# Patient Record
Sex: Female | Born: 1945 | Race: White | Hispanic: No | Marital: Married | Smoking: Never smoker
Health system: Southern US, Community
[De-identification: ages and names within clinical notes are randomized; demographics above are authoritative.]

## PROBLEM LIST (undated history)

## (undated) DIAGNOSIS — F329 Major depressive disorder, single episode, unspecified: Secondary | ICD-10-CM

## (undated) DIAGNOSIS — E7800 Pure hypercholesterolemia, unspecified: Secondary | ICD-10-CM

## (undated) DIAGNOSIS — E039 Hypothyroidism, unspecified: Secondary | ICD-10-CM

## (undated) DIAGNOSIS — S62109A Fracture of unspecified carpal bone, unspecified wrist, initial encounter for closed fracture: Secondary | ICD-10-CM

## (undated) DIAGNOSIS — F32A Depression, unspecified: Secondary | ICD-10-CM

## (undated) DIAGNOSIS — K219 Gastro-esophageal reflux disease without esophagitis: Secondary | ICD-10-CM

## (undated) DIAGNOSIS — M810 Age-related osteoporosis without current pathological fracture: Secondary | ICD-10-CM

## (undated) DIAGNOSIS — R011 Cardiac murmur, unspecified: Secondary | ICD-10-CM

## (undated) DIAGNOSIS — F419 Anxiety disorder, unspecified: Secondary | ICD-10-CM

## (undated) DIAGNOSIS — N159 Renal tubulo-interstitial disease, unspecified: Secondary | ICD-10-CM

## (undated) HISTORY — PX: FINGER SURGERY: SHX640

## (undated) HISTORY — PX: TONSILLECTOMY: SUR1361

## (undated) HISTORY — PX: COLONOSCOPY: SHX174

## (undated) HISTORY — PX: BREAST SURGERY: SHX581

---

## 1997-07-05 ENCOUNTER — Other Ambulatory Visit: Admission: RE | Admit: 1997-07-05 | Discharge: 1997-07-05 | Payer: Self-pay | Admitting: Gynecology

## 1998-05-03 ENCOUNTER — Encounter: Payer: Self-pay | Admitting: Gynecology

## 1998-05-03 ENCOUNTER — Ambulatory Visit (HOSPITAL_COMMUNITY): Admission: RE | Admit: 1998-05-03 | Discharge: 1998-05-03 | Payer: Self-pay | Admitting: Gynecology

## 1998-07-12 ENCOUNTER — Other Ambulatory Visit: Admission: RE | Admit: 1998-07-12 | Discharge: 1998-07-12 | Payer: Self-pay | Admitting: Gynecology

## 1999-05-07 ENCOUNTER — Ambulatory Visit (HOSPITAL_COMMUNITY): Admission: RE | Admit: 1999-05-07 | Discharge: 1999-05-07 | Payer: Self-pay | Admitting: Gynecology

## 1999-05-07 ENCOUNTER — Encounter: Payer: Self-pay | Admitting: Gynecology

## 1999-09-09 ENCOUNTER — Other Ambulatory Visit: Admission: RE | Admit: 1999-09-09 | Discharge: 1999-09-09 | Payer: Self-pay | Admitting: Gynecology

## 2000-05-15 ENCOUNTER — Encounter: Payer: Self-pay | Admitting: Gynecology

## 2000-05-15 ENCOUNTER — Ambulatory Visit (HOSPITAL_COMMUNITY): Admission: RE | Admit: 2000-05-15 | Discharge: 2000-05-15 | Payer: Self-pay | Admitting: Gynecology

## 2000-09-15 ENCOUNTER — Other Ambulatory Visit: Admission: RE | Admit: 2000-09-15 | Discharge: 2000-09-15 | Payer: Self-pay | Admitting: Gynecology

## 2001-07-22 ENCOUNTER — Encounter: Payer: Self-pay | Admitting: *Deleted

## 2001-07-22 ENCOUNTER — Ambulatory Visit (HOSPITAL_COMMUNITY): Admission: RE | Admit: 2001-07-22 | Discharge: 2001-07-22 | Payer: Self-pay | Admitting: *Deleted

## 2002-07-26 ENCOUNTER — Ambulatory Visit (HOSPITAL_COMMUNITY): Admission: RE | Admit: 2002-07-26 | Discharge: 2002-07-26 | Payer: Self-pay | Admitting: Gynecology

## 2002-07-26 ENCOUNTER — Encounter: Payer: Self-pay | Admitting: Gynecology

## 2002-10-25 ENCOUNTER — Other Ambulatory Visit: Admission: RE | Admit: 2002-10-25 | Discharge: 2002-10-25 | Payer: Self-pay | Admitting: Gynecology

## 2003-08-04 ENCOUNTER — Ambulatory Visit (HOSPITAL_COMMUNITY): Admission: RE | Admit: 2003-08-04 | Discharge: 2003-08-04 | Payer: Self-pay | Admitting: *Deleted

## 2004-01-08 ENCOUNTER — Other Ambulatory Visit: Admission: RE | Admit: 2004-01-08 | Discharge: 2004-01-08 | Payer: Self-pay | Admitting: Gynecology

## 2004-09-10 ENCOUNTER — Ambulatory Visit (HOSPITAL_COMMUNITY): Admission: RE | Admit: 2004-09-10 | Discharge: 2004-09-10 | Payer: Self-pay | Admitting: Gynecology

## 2004-09-18 ENCOUNTER — Encounter: Admission: RE | Admit: 2004-09-18 | Discharge: 2004-09-18 | Payer: Self-pay | Admitting: Gynecology

## 2005-01-08 ENCOUNTER — Emergency Department (HOSPITAL_COMMUNITY): Admission: EM | Admit: 2005-01-08 | Discharge: 2005-01-09 | Payer: Self-pay | Admitting: Emergency Medicine

## 2005-01-09 ENCOUNTER — Other Ambulatory Visit: Admission: RE | Admit: 2005-01-09 | Discharge: 2005-01-09 | Payer: Self-pay | Admitting: Gynecology

## 2005-09-11 ENCOUNTER — Ambulatory Visit (HOSPITAL_COMMUNITY): Admission: RE | Admit: 2005-09-11 | Discharge: 2005-09-11 | Payer: Self-pay | Admitting: *Deleted

## 2006-09-25 ENCOUNTER — Ambulatory Visit (HOSPITAL_COMMUNITY): Admission: RE | Admit: 2006-09-25 | Discharge: 2006-09-25 | Payer: Self-pay | Admitting: *Deleted

## 2006-09-30 ENCOUNTER — Ambulatory Visit (HOSPITAL_COMMUNITY): Admission: RE | Admit: 2006-09-30 | Discharge: 2006-09-30 | Payer: Self-pay | Admitting: *Deleted

## 2007-10-20 ENCOUNTER — Ambulatory Visit (HOSPITAL_COMMUNITY): Admission: RE | Admit: 2007-10-20 | Discharge: 2007-10-20 | Payer: Self-pay | Admitting: Family Medicine

## 2008-10-23 ENCOUNTER — Ambulatory Visit (HOSPITAL_COMMUNITY): Admission: RE | Admit: 2008-10-23 | Discharge: 2008-10-23 | Payer: Self-pay | Admitting: Gynecology

## 2008-10-27 ENCOUNTER — Encounter: Admission: RE | Admit: 2008-10-27 | Discharge: 2008-10-27 | Payer: Self-pay | Admitting: Gynecology

## 2008-11-07 ENCOUNTER — Encounter (INDEPENDENT_AMBULATORY_CARE_PROVIDER_SITE_OTHER): Payer: Self-pay | Admitting: Gynecology

## 2008-11-07 ENCOUNTER — Encounter: Admission: RE | Admit: 2008-11-07 | Discharge: 2008-11-07 | Payer: Self-pay | Admitting: Gynecology

## 2008-11-08 ENCOUNTER — Encounter: Admission: RE | Admit: 2008-11-08 | Discharge: 2008-11-08 | Payer: Self-pay | Admitting: Gynecology

## 2009-11-08 ENCOUNTER — Encounter: Admission: RE | Admit: 2009-11-08 | Discharge: 2009-11-08 | Payer: Self-pay | Admitting: Gynecology

## 2010-03-03 ENCOUNTER — Encounter: Payer: Self-pay | Admitting: Gynecology

## 2010-03-04 ENCOUNTER — Encounter: Payer: Self-pay | Admitting: Gynecology

## 2010-09-16 ENCOUNTER — Other Ambulatory Visit: Payer: Self-pay | Admitting: Gynecology

## 2010-09-16 DIAGNOSIS — Z1231 Encounter for screening mammogram for malignant neoplasm of breast: Secondary | ICD-10-CM

## 2010-11-11 ENCOUNTER — Ambulatory Visit
Admission: RE | Admit: 2010-11-11 | Discharge: 2010-11-11 | Disposition: A | Discharge status: Home / Self Care | Payer: Medicare Other | Source: Ambulatory Visit | Attending: Gynecology | Admitting: Gynecology

## 2010-11-11 DIAGNOSIS — Z1231 Encounter for screening mammogram for malignant neoplasm of breast: Secondary | ICD-10-CM

## 2010-11-14 ENCOUNTER — Other Ambulatory Visit: Payer: Self-pay | Admitting: Gynecology

## 2011-03-10 ENCOUNTER — Other Ambulatory Visit: Payer: Self-pay | Admitting: Family Medicine

## 2011-03-17 ENCOUNTER — Ambulatory Visit
Admission: RE | Admit: 2011-03-17 | Discharge: 2011-03-17 | Disposition: A | Discharge status: Home / Self Care | Payer: Medicare Other | Source: Ambulatory Visit | Attending: Family Medicine | Admitting: Family Medicine

## 2011-10-07 ENCOUNTER — Other Ambulatory Visit: Payer: Self-pay | Admitting: Family Medicine

## 2011-10-08 ENCOUNTER — Other Ambulatory Visit: Payer: Self-pay | Admitting: Family Medicine

## 2011-10-08 DIAGNOSIS — N63 Unspecified lump in unspecified breast: Secondary | ICD-10-CM

## 2011-10-15 ENCOUNTER — Ambulatory Visit
Admission: RE | Admit: 2011-10-15 | Discharge: 2011-10-15 | Disposition: A | Discharge status: Home / Self Care | Payer: BC Managed Care – PPO | Source: Ambulatory Visit | Attending: Family Medicine | Admitting: Family Medicine

## 2011-10-15 DIAGNOSIS — N63 Unspecified lump in unspecified breast: Secondary | ICD-10-CM

## 2011-10-27 ENCOUNTER — Other Ambulatory Visit: Payer: Self-pay | Admitting: Family Medicine

## 2011-10-27 DIAGNOSIS — Z1231 Encounter for screening mammogram for malignant neoplasm of breast: Secondary | ICD-10-CM

## 2011-11-13 ENCOUNTER — Ambulatory Visit: Payer: BC Managed Care – PPO

## 2011-11-19 ENCOUNTER — Ambulatory Visit
Admission: RE | Admit: 2011-11-19 | Discharge: 2011-11-19 | Disposition: A | Discharge status: Home / Self Care | Payer: Medicare Other | Source: Ambulatory Visit | Attending: Family Medicine | Admitting: Family Medicine

## 2011-11-19 DIAGNOSIS — Z1231 Encounter for screening mammogram for malignant neoplasm of breast: Secondary | ICD-10-CM

## 2012-03-16 ENCOUNTER — Other Ambulatory Visit: Payer: Self-pay | Admitting: Gastroenterology

## 2012-03-16 DIAGNOSIS — R1310 Dysphagia, unspecified: Secondary | ICD-10-CM

## 2012-03-24 ENCOUNTER — Other Ambulatory Visit: Payer: Medicare Other

## 2012-03-31 ENCOUNTER — Other Ambulatory Visit: Payer: Medicare Other

## 2012-04-08 ENCOUNTER — Other Ambulatory Visit: Payer: Medicare Other

## 2012-05-24 ENCOUNTER — Other Ambulatory Visit: Payer: Medicare Other

## 2012-05-31 ENCOUNTER — Ambulatory Visit
Admission: RE | Admit: 2012-05-31 | Discharge: 2012-05-31 | Disposition: A | Discharge status: Home / Self Care | Payer: Medicare Other | Source: Ambulatory Visit | Attending: Gastroenterology | Admitting: Gastroenterology

## 2012-05-31 DIAGNOSIS — R1310 Dysphagia, unspecified: Secondary | ICD-10-CM

## 2012-10-19 ENCOUNTER — Other Ambulatory Visit: Payer: Self-pay

## 2012-10-19 DIAGNOSIS — Z1231 Encounter for screening mammogram for malignant neoplasm of breast: Secondary | ICD-10-CM

## 2012-11-25 ENCOUNTER — Ambulatory Visit: Payer: Medicare Other

## 2012-11-29 ENCOUNTER — Ambulatory Visit
Admission: RE | Admit: 2012-11-29 | Discharge: 2012-11-29 | Disposition: A | Discharge status: Home / Self Care | Payer: 59 | Source: Ambulatory Visit

## 2012-11-29 DIAGNOSIS — Z1231 Encounter for screening mammogram for malignant neoplasm of breast: Secondary | ICD-10-CM

## 2013-10-20 ENCOUNTER — Other Ambulatory Visit: Payer: Self-pay

## 2013-10-20 DIAGNOSIS — Z1231 Encounter for screening mammogram for malignant neoplasm of breast: Secondary | ICD-10-CM

## 2013-12-01 ENCOUNTER — Ambulatory Visit
Admission: RE | Admit: 2013-12-01 | Discharge: 2013-12-01 | Disposition: A | Discharge status: Home / Self Care | Payer: 59 | Source: Ambulatory Visit

## 2013-12-01 ENCOUNTER — Encounter (INDEPENDENT_AMBULATORY_CARE_PROVIDER_SITE_OTHER): Payer: Self-pay

## 2013-12-01 DIAGNOSIS — Z1231 Encounter for screening mammogram for malignant neoplasm of breast: Secondary | ICD-10-CM

## 2013-12-08 ENCOUNTER — Other Ambulatory Visit: Payer: Self-pay | Admitting: Gynecology

## 2013-12-09 LAB — CYTOLOGY - PAP

## 2014-09-15 ENCOUNTER — Other Ambulatory Visit: Payer: Self-pay | Admitting: Family Medicine

## 2014-09-15 DIAGNOSIS — M810 Age-related osteoporosis without current pathological fracture: Secondary | ICD-10-CM

## 2014-09-21 ENCOUNTER — Other Ambulatory Visit: Payer: Self-pay

## 2014-09-26 ENCOUNTER — Inpatient Hospital Stay: Admission: RE | Admit: 2014-09-26 | Payer: Self-pay | Source: Ambulatory Visit

## 2014-10-05 ENCOUNTER — Other Ambulatory Visit: Payer: Self-pay | Admitting: Family Medicine

## 2014-10-05 DIAGNOSIS — Z780 Asymptomatic menopausal state: Secondary | ICD-10-CM

## 2014-10-05 DIAGNOSIS — M810 Age-related osteoporosis without current pathological fracture: Secondary | ICD-10-CM

## 2014-10-09 ENCOUNTER — Ambulatory Visit
Admission: RE | Admit: 2014-10-09 | Discharge: 2014-10-09 | Disposition: A | Discharge status: Home / Self Care | Payer: Medicare Other | Source: Ambulatory Visit | Attending: Family Medicine | Admitting: Family Medicine

## 2014-10-09 ENCOUNTER — Other Ambulatory Visit: Payer: Self-pay | Admitting: Family Medicine

## 2014-10-09 DIAGNOSIS — M810 Age-related osteoporosis without current pathological fracture: Secondary | ICD-10-CM

## 2014-10-09 DIAGNOSIS — E2839 Other primary ovarian failure: Secondary | ICD-10-CM

## 2014-10-09 DIAGNOSIS — Z780 Asymptomatic menopausal state: Secondary | ICD-10-CM

## 2014-11-02 ENCOUNTER — Other Ambulatory Visit: Payer: Self-pay

## 2014-11-02 DIAGNOSIS — Z1231 Encounter for screening mammogram for malignant neoplasm of breast: Secondary | ICD-10-CM

## 2014-12-07 ENCOUNTER — Ambulatory Visit
Admission: RE | Admit: 2014-12-07 | Discharge: 2014-12-07 | Disposition: A | Discharge status: Home / Self Care | Payer: Medicare Other | Source: Ambulatory Visit

## 2014-12-07 DIAGNOSIS — Z1231 Encounter for screening mammogram for malignant neoplasm of breast: Secondary | ICD-10-CM

## 2015-11-12 ENCOUNTER — Other Ambulatory Visit: Payer: Self-pay | Admitting: Family Medicine

## 2015-11-12 DIAGNOSIS — Z1231 Encounter for screening mammogram for malignant neoplasm of breast: Secondary | ICD-10-CM

## 2015-12-11 ENCOUNTER — Ambulatory Visit
Admission: RE | Admit: 2015-12-11 | Discharge: 2015-12-11 | Disposition: A | Discharge status: Home / Self Care | Payer: Medicare Other | Source: Ambulatory Visit | Attending: Family Medicine | Admitting: Family Medicine

## 2015-12-11 DIAGNOSIS — Z1231 Encounter for screening mammogram for malignant neoplasm of breast: Secondary | ICD-10-CM

## 2016-11-03 ENCOUNTER — Other Ambulatory Visit: Payer: Self-pay | Admitting: Family Medicine

## 2016-11-03 DIAGNOSIS — Z1231 Encounter for screening mammogram for malignant neoplasm of breast: Secondary | ICD-10-CM

## 2016-12-11 ENCOUNTER — Ambulatory Visit
Admission: RE | Admit: 2016-12-11 | Discharge: 2016-12-11 | Disposition: A | Discharge status: Home / Self Care | Payer: Medicare Other | Source: Ambulatory Visit | Attending: Family Medicine | Admitting: Family Medicine

## 2016-12-11 DIAGNOSIS — Z1231 Encounter for screening mammogram for malignant neoplasm of breast: Secondary | ICD-10-CM

## 2017-01-13 ENCOUNTER — Other Ambulatory Visit: Payer: Self-pay | Admitting: Family Medicine

## 2017-01-13 DIAGNOSIS — M810 Age-related osteoporosis without current pathological fracture: Secondary | ICD-10-CM

## 2017-02-12 ENCOUNTER — Inpatient Hospital Stay
Admission: RE | Admit: 2017-02-12 | Discharge: 2017-02-12 | Disposition: A | Discharge status: Home / Self Care | Payer: Medicare Other | Source: Ambulatory Visit | Attending: Family Medicine | Admitting: Family Medicine

## 2017-03-05 ENCOUNTER — Ambulatory Visit
Admission: RE | Admit: 2017-03-05 | Discharge: 2017-03-05 | Disposition: A | Discharge status: Home / Self Care | Payer: Medicare Other | Source: Ambulatory Visit | Attending: Family Medicine | Admitting: Family Medicine

## 2017-03-05 DIAGNOSIS — M810 Age-related osteoporosis without current pathological fracture: Secondary | ICD-10-CM

## 2017-05-12 ENCOUNTER — Other Ambulatory Visit: Payer: Self-pay | Admitting: Family Medicine

## 2017-05-12 DIAGNOSIS — N6320 Unspecified lump in the left breast, unspecified quadrant: Secondary | ICD-10-CM

## 2017-05-15 ENCOUNTER — Other Ambulatory Visit: Payer: Medicare Other

## 2017-05-18 ENCOUNTER — Other Ambulatory Visit: Payer: Self-pay | Admitting: Family Medicine

## 2017-05-18 ENCOUNTER — Ambulatory Visit
Admission: RE | Admit: 2017-05-18 | Discharge: 2017-05-18 | Disposition: A | Discharge status: Home / Self Care | Payer: Medicare Other | Source: Ambulatory Visit | Attending: Family Medicine | Admitting: Family Medicine

## 2017-05-18 DIAGNOSIS — N6320 Unspecified lump in the left breast, unspecified quadrant: Secondary | ICD-10-CM

## 2017-11-02 ENCOUNTER — Other Ambulatory Visit: Payer: Self-pay | Admitting: Family Medicine

## 2017-11-02 DIAGNOSIS — Z1231 Encounter for screening mammogram for malignant neoplasm of breast: Secondary | ICD-10-CM

## 2017-12-17 ENCOUNTER — Ambulatory Visit
Admission: RE | Admit: 2017-12-17 | Discharge: 2017-12-17 | Disposition: A | Discharge status: Home / Self Care | Payer: Medicare Other | Source: Ambulatory Visit | Attending: Family Medicine | Admitting: Family Medicine

## 2017-12-17 DIAGNOSIS — Z1231 Encounter for screening mammogram for malignant neoplasm of breast: Secondary | ICD-10-CM

## 2018-01-11 ENCOUNTER — Encounter (HOSPITAL_COMMUNITY): Payer: Self-pay | Admitting: *Deleted

## 2018-01-11 ENCOUNTER — Other Ambulatory Visit: Payer: Self-pay

## 2018-01-11 NOTE — Progress Notes (Signed)
Pt denies SOB, chest pain, and being under the care of a cardiologist. Pt denies having an echo, stress test and cardiac cath. Pt denies having an EKG within the last year but stated that she is " unsure " where her recent chest x ray was performed. Pt made aware to stop taking vitamins, fish oil and herbal medications. Do not take any NSAIDs ie: Ibuprofen, Advil, Naproxen (Aleve), Motrin, BC and Goody Powder. Pt verbalized understanding of all pre-op instructions.

## 2018-01-12 ENCOUNTER — Encounter (HOSPITAL_COMMUNITY)
Admission: RE | Disposition: A | Discharge status: Home / Self Care | Payer: Self-pay | Source: Ambulatory Visit | Attending: Orthopedic Surgery

## 2018-01-12 ENCOUNTER — Observation Stay (HOSPITAL_COMMUNITY)
Admission: RE | Admit: 2018-01-12 | Discharge: 2018-01-13 | Disposition: A | Discharge status: Home / Self Care | Payer: Medicare Other | Source: Ambulatory Visit | Attending: Orthopedic Surgery | Admitting: Orthopedic Surgery

## 2018-01-12 ENCOUNTER — Ambulatory Visit (HOSPITAL_COMMUNITY): Payer: Medicare Other | Admitting: Certified Registered"

## 2018-01-12 ENCOUNTER — Encounter (HOSPITAL_COMMUNITY): Payer: Self-pay | Admitting: Certified Registered"

## 2018-01-12 ENCOUNTER — Other Ambulatory Visit: Payer: Self-pay

## 2018-01-12 DIAGNOSIS — S52612A Displaced fracture of left ulna styloid process, initial encounter for closed fracture: Secondary | ICD-10-CM | POA: Diagnosis not present

## 2018-01-12 DIAGNOSIS — Z79899 Other long term (current) drug therapy: Secondary | ICD-10-CM | POA: Insufficient documentation

## 2018-01-12 DIAGNOSIS — Z79890 Hormone replacement therapy (postmenopausal): Secondary | ICD-10-CM | POA: Diagnosis not present

## 2018-01-12 DIAGNOSIS — X58XXXA Exposure to other specified factors, initial encounter: Secondary | ICD-10-CM | POA: Insufficient documentation

## 2018-01-12 DIAGNOSIS — S52532A Colles' fracture of left radius, initial encounter for closed fracture: Secondary | ICD-10-CM | POA: Diagnosis present

## 2018-01-12 DIAGNOSIS — F329 Major depressive disorder, single episode, unspecified: Secondary | ICD-10-CM | POA: Insufficient documentation

## 2018-01-12 DIAGNOSIS — M810 Age-related osteoporosis without current pathological fracture: Secondary | ICD-10-CM | POA: Insufficient documentation

## 2018-01-12 DIAGNOSIS — E7800 Pure hypercholesterolemia, unspecified: Secondary | ICD-10-CM | POA: Diagnosis not present

## 2018-01-12 DIAGNOSIS — Z7983 Long term (current) use of bisphosphonates: Secondary | ICD-10-CM | POA: Diagnosis not present

## 2018-01-12 DIAGNOSIS — F419 Anxiety disorder, unspecified: Secondary | ICD-10-CM | POA: Diagnosis not present

## 2018-01-12 DIAGNOSIS — E039 Hypothyroidism, unspecified: Secondary | ICD-10-CM | POA: Diagnosis not present

## 2018-01-12 DIAGNOSIS — R2681 Unsteadiness on feet: Secondary | ICD-10-CM | POA: Diagnosis not present

## 2018-01-12 HISTORY — DX: Renal tubulo-interstitial disease, unspecified: N15.9

## 2018-01-12 HISTORY — DX: Fracture of unspecified carpal bone, unspecified wrist, initial encounter for closed fracture: S62.109A

## 2018-01-12 HISTORY — DX: Major depressive disorder, single episode, unspecified: F32.9

## 2018-01-12 HISTORY — DX: Gastro-esophageal reflux disease without esophagitis: K21.9

## 2018-01-12 HISTORY — DX: Pure hypercholesterolemia, unspecified: E78.00

## 2018-01-12 HISTORY — DX: Anxiety disorder, unspecified: F41.9

## 2018-01-12 HISTORY — DX: Cardiac murmur, unspecified: R01.1

## 2018-01-12 HISTORY — DX: Age-related osteoporosis without current pathological fracture: M81.0

## 2018-01-12 HISTORY — PX: ORIF WRIST FRACTURE: SHX2133

## 2018-01-12 HISTORY — DX: Depression, unspecified: F32.A

## 2018-01-12 HISTORY — DX: Hypothyroidism, unspecified: E03.9

## 2018-01-12 LAB — CBC
HCT: 41.4 % (ref 36.0–46.0)
Hemoglobin: 12.7 g/dL (ref 12.0–15.0)
MCH: 28 pg (ref 26.0–34.0)
MCHC: 30.7 g/dL (ref 30.0–36.0)
MCV: 91.4 fL (ref 80.0–100.0)
PLATELETS: 226 10*3/uL (ref 150–400)
RBC: 4.53 MIL/uL (ref 3.87–5.11)
RDW: 13.9 % (ref 11.5–15.5)
WBC: 9.2 10*3/uL (ref 4.0–10.5)
nRBC: 0 % (ref 0.0–0.2)

## 2018-01-12 SURGERY — OPEN REDUCTION INTERNAL FIXATION (ORIF) WRIST FRACTURE
Anesthesia: General | Site: Wrist | Laterality: Left

## 2018-01-12 MED ORDER — METHOCARBAMOL 500 MG PO TABS
500.0000 mg | ORAL_TABLET | Freq: Four times a day (QID) | ORAL | Status: DC | PRN
  Administered 2018-01-12: 500 mg via ORAL
  Filled 2018-01-12: qty 1

## 2018-01-12 MED ORDER — ATORVASTATIN CALCIUM 10 MG PO TABS
20.0000 mg | ORAL_TABLET | Freq: Every day | ORAL | Status: DC
  Administered 2018-01-12 – 2018-01-13 (×2): 20 mg via ORAL
  Filled 2018-01-12 (×2): qty 2

## 2018-01-12 MED ORDER — SUCCINYLCHOLINE CHLORIDE 200 MG/10ML IV SOSY
PREFILLED_SYRINGE | INTRAVENOUS | Status: DC | PRN
  Administered 2018-01-12: 100 mg via INTRAVENOUS

## 2018-01-12 MED ORDER — LACTATED RINGERS IV SOLN
INTRAVENOUS | Status: DC
  Administered 2018-01-12: 22:00:00 via INTRAVENOUS

## 2018-01-12 MED ORDER — 0.9 % SODIUM CHLORIDE (POUR BTL) OPTIME
TOPICAL | Status: DC | PRN
  Administered 2018-01-12: 1000 mL

## 2018-01-12 MED ORDER — MIDAZOLAM HCL 2 MG/2ML IJ SOLN
INTRAMUSCULAR | Status: AC
  Filled 2018-01-12: qty 2

## 2018-01-12 MED ORDER — PROMETHAZINE HCL 25 MG/ML IJ SOLN: 6.2500 mg | INTRAMUSCULAR | Status: DC | PRN

## 2018-01-12 MED ORDER — CEFAZOLIN SODIUM-DEXTROSE 1-4 GM/50ML-% IV SOLN
1.0000 g | Freq: Three times a day (TID) | INTRAVENOUS | Status: DC
  Administered 2018-01-13 (×2): 1 g via INTRAVENOUS
  Filled 2018-01-12 (×3): qty 50

## 2018-01-12 MED ORDER — FENTANYL CITRATE (PF) 100 MCG/2ML IJ SOLN
INTRAMUSCULAR | Status: DC | PRN
  Administered 2018-01-12: 50 ug via INTRAVENOUS

## 2018-01-12 MED ORDER — MECLIZINE HCL 25 MG PO TABS
25.0000 mg | ORAL_TABLET | Freq: Three times a day (TID) | ORAL | Status: DC | PRN
  Administered 2018-01-13: 25 mg via ORAL
  Filled 2018-01-12 (×2): qty 1

## 2018-01-12 MED ORDER — DOCUSATE SODIUM 100 MG PO CAPS
100.0000 mg | ORAL_CAPSULE | Freq: Two times a day (BID) | ORAL | Status: DC
  Administered 2018-01-12 – 2018-01-13 (×2): 100 mg via ORAL
  Filled 2018-01-12 (×2): qty 1

## 2018-01-12 MED ORDER — METHOCARBAMOL 1000 MG/10ML IJ SOLN
500.0000 mg | Freq: Four times a day (QID) | INTRAVENOUS | Status: DC | PRN
  Filled 2018-01-12: qty 5

## 2018-01-12 MED ORDER — HYDROMORPHONE HCL 1 MG/ML IJ SOLN
0.5000 mg | INTRAMUSCULAR | Status: DC | PRN
  Administered 2018-01-12 – 2018-01-13 (×2): 1 mg via INTRAVENOUS
  Filled 2018-01-12 (×2): qty 1

## 2018-01-12 MED ORDER — LIDOCAINE 2% (20 MG/ML) 5 ML SYRINGE
INTRAMUSCULAR | Status: AC
  Filled 2018-01-12: qty 5

## 2018-01-12 MED ORDER — ACETAMINOPHEN 10 MG/ML IV SOLN: 1000.0000 mg | Freq: Once | INTRAVENOUS | Status: DC | PRN

## 2018-01-12 MED ORDER — FENTANYL CITRATE (PF) 100 MCG/2ML IJ SOLN
INTRAMUSCULAR | Status: AC
  Filled 2018-01-12: qty 2

## 2018-01-12 MED ORDER — FAMOTIDINE 20 MG PO TABS: 20.0000 mg | ORAL_TABLET | Freq: Two times a day (BID) | ORAL | Status: DC | PRN

## 2018-01-12 MED ORDER — FENTANYL CITRATE (PF) 250 MCG/5ML IJ SOLN
INTRAMUSCULAR | Status: AC
  Filled 2018-01-12: qty 5

## 2018-01-12 MED ORDER — ALPRAZOLAM 0.25 MG PO TABS: 0.2500 mg | ORAL_TABLET | Freq: Every evening | ORAL | Status: DC | PRN

## 2018-01-12 MED ORDER — DEXAMETHASONE SODIUM PHOSPHATE 10 MG/ML IJ SOLN
INTRAMUSCULAR | Status: DC | PRN
  Administered 2018-01-12: 10 mg via INTRAVENOUS

## 2018-01-12 MED ORDER — PAROXETINE HCL 20 MG PO TABS
40.0000 mg | ORAL_TABLET | Freq: Every day | ORAL | Status: DC
  Administered 2018-01-12 – 2018-01-13 (×2): 40 mg via ORAL
  Filled 2018-01-12 (×2): qty 2

## 2018-01-12 MED ORDER — CHLORHEXIDINE GLUCONATE 4 % EX LIQD: 60.0000 mL | Freq: Once | CUTANEOUS | Status: DC

## 2018-01-12 MED ORDER — MEPIVACAINE HCL 1.5 % IJ SOLN
INTRAMUSCULAR | Status: DC | PRN
  Administered 2018-01-12: 30 mL via PERINEURAL

## 2018-01-12 MED ORDER — LIDOCAINE 2% (20 MG/ML) 5 ML SYRINGE
INTRAMUSCULAR | Status: DC | PRN
  Administered 2018-01-12: 100 mg via INTRAVENOUS

## 2018-01-12 MED ORDER — LEVOTHYROXINE SODIUM 100 MCG PO TABS
100.0000 ug | ORAL_TABLET | Freq: Every day | ORAL | Status: DC
  Administered 2018-01-13: 100 ug via ORAL
  Filled 2018-01-12: qty 1

## 2018-01-12 MED ORDER — CEFAZOLIN SODIUM-DEXTROSE 1-4 GM/50ML-% IV SOLN
1.0000 g | Freq: Once | INTRAVENOUS | Status: AC
  Administered 2018-01-12: 1 g via INTRAVENOUS
  Filled 2018-01-12: qty 50

## 2018-01-12 MED ORDER — MIDAZOLAM HCL 5 MG/5ML IJ SOLN
INTRAMUSCULAR | Status: DC | PRN
  Administered 2018-01-12: 2 mg via INTRAVENOUS

## 2018-01-12 MED ORDER — PROPOFOL 10 MG/ML IV BOLUS
INTRAVENOUS | Status: DC | PRN
  Administered 2018-01-12: 40 mg via INTRAVENOUS
  Administered 2018-01-12: 100 mg via INTRAVENOUS
  Administered 2018-01-12: 50 mg via INTRAVENOUS
  Administered 2018-01-12: 140 mg via INTRAVENOUS

## 2018-01-12 MED ORDER — LACTATED RINGERS IV SOLN
INTRAVENOUS | Status: DC
  Administered 2018-01-12: 15:00:00 via INTRAVENOUS

## 2018-01-12 MED ORDER — FENTANYL CITRATE (PF) 100 MCG/2ML IJ SOLN: 25.0000 ug | INTRAMUSCULAR | Status: DC | PRN

## 2018-01-12 MED ORDER — PROPOFOL 10 MG/ML IV BOLUS
INTRAVENOUS | Status: AC
  Filled 2018-01-12: qty 20

## 2018-01-12 MED ORDER — VITAMIN C 500 MG PO TABS
1000.0000 mg | ORAL_TABLET | Freq: Every day | ORAL | Status: DC
  Administered 2018-01-12 – 2018-01-13 (×2): 1000 mg via ORAL
  Filled 2018-01-12 (×2): qty 2

## 2018-01-12 MED ORDER — CEFAZOLIN SODIUM-DEXTROSE 2-4 GM/100ML-% IV SOLN
2.0000 g | INTRAVENOUS | Status: AC
  Administered 2018-01-12: 2 g via INTRAVENOUS
  Filled 2018-01-12: qty 100

## 2018-01-12 MED ORDER — ONDANSETRON HCL 4 MG/2ML IJ SOLN
INTRAMUSCULAR | Status: DC | PRN
  Administered 2018-01-12: 4 mg via INTRAVENOUS

## 2018-01-12 MED ORDER — OXYCODONE HCL 5 MG PO TABS: 5.0000 mg | ORAL_TABLET | ORAL | Status: DC | PRN

## 2018-01-12 SURGICAL SUPPLY — 67 items
BANDAGE ACE 3X5.8 VEL STRL LF (GAUZE/BANDAGES/DRESSINGS) ×2 IMPLANT
BANDAGE ACE 4X5 VEL STRL LF (GAUZE/BANDAGES/DRESSINGS) ×2 IMPLANT
BANDAGE ELASTIC 3 VELCRO ST LF (GAUZE/BANDAGES/DRESSINGS) ×1 IMPLANT
BANDAGE ELASTIC 4 VELCRO ST LF (GAUZE/BANDAGES/DRESSINGS) ×1 IMPLANT
BIT DRILL 2.2 SS TIBIAL (BIT) ×1 IMPLANT
BLADE CLIPPER SURG (BLADE) IMPLANT
BNDG CMPR 9X4 STRL LF SNTH (GAUZE/BANDAGES/DRESSINGS) ×1
BNDG ESMARK 4X9 LF (GAUZE/BANDAGES/DRESSINGS) ×2 IMPLANT
BNDG GAUZE ELAST 4 BULKY (GAUZE/BANDAGES/DRESSINGS) ×2 IMPLANT
CANISTER SUCT 3000ML PPV (MISCELLANEOUS) ×2 IMPLANT
CORDS BIPOLAR (ELECTRODE) ×2 IMPLANT
COVER SURGICAL LIGHT HANDLE (MISCELLANEOUS) ×2 IMPLANT
COVER WAND RF STERILE (DRAPES) ×2 IMPLANT
CUFF TOURNIQUET SINGLE 18IN (TOURNIQUET CUFF) ×2 IMPLANT
CUFF TOURNIQUET SINGLE 24IN (TOURNIQUET CUFF) IMPLANT
DRAIN TLS ROUND 10FR (DRAIN) IMPLANT
DRAPE OEC MINIVIEW 54X84 (DRAPES) ×2 IMPLANT
DRAPE SURG 17X23 STRL (DRAPES) ×2 IMPLANT
DRSG ADAPTIC 3X8 NADH LF (GAUZE/BANDAGES/DRESSINGS) ×1 IMPLANT
GAUZE SPONGE 4X4 12PLY STRL (GAUZE/BANDAGES/DRESSINGS) ×2 IMPLANT
GAUZE XEROFORM 1X8 LF (GAUZE/BANDAGES/DRESSINGS) ×2 IMPLANT
GAUZE XEROFORM 5X9 LF (GAUZE/BANDAGES/DRESSINGS) ×1 IMPLANT
GLOVE BIOGEL M 8.0 STRL (GLOVE) ×2 IMPLANT
GLOVE SS BIOGEL STRL SZ 8 (GLOVE) ×1 IMPLANT
GLOVE SUPERSENSE BIOGEL SZ 8 (GLOVE) ×1
GOWN STRL REUS W/ TWL LRG LVL3 (GOWN DISPOSABLE) ×1 IMPLANT
GOWN STRL REUS W/ TWL XL LVL3 (GOWN DISPOSABLE) ×2 IMPLANT
GOWN STRL REUS W/TWL LRG LVL3 (GOWN DISPOSABLE) ×2
GOWN STRL REUS W/TWL XL LVL3 (GOWN DISPOSABLE) ×4
KIT BASIN OR (CUSTOM PROCEDURE TRAY) ×2 IMPLANT
KIT TURNOVER KIT B (KITS) ×2 IMPLANT
LOOP VESSEL MAXI BLUE (MISCELLANEOUS) IMPLANT
MANIFOLD NEPTUNE II (INSTRUMENTS) ×2 IMPLANT
NEEDLE 22X1 1/2 (OR ONLY) (NEEDLE) IMPLANT
NS IRRIG 1000ML POUR BTL (IV SOLUTION) ×2 IMPLANT
PACK ORTHO EXTREMITY (CUSTOM PROCEDURE TRAY) ×2 IMPLANT
PAD ARMBOARD 7.5X6 YLW CONV (MISCELLANEOUS) ×4 IMPLANT
PAD CAST 3X4 CTTN HI CHSV (CAST SUPPLIES) ×1 IMPLANT
PAD CAST 4YDX4 CTTN HI CHSV (CAST SUPPLIES) ×1 IMPLANT
PADDING CAST COTTON 3X4 STRL (CAST SUPPLIES) ×2
PADDING CAST COTTON 4X4 STRL (CAST SUPPLIES) ×2
PEG LOCKING SMOOTH 2.2X20 (Screw) ×1 IMPLANT
PEG LOCKING SMOOTH 2.2X22 (Screw) ×4 IMPLANT
PLATE STD DVR LEFT (Plate) ×2 IMPLANT
PLATE STD DVR LT 24X55 (Plate) IMPLANT
SCREW LOCK 12X2.7X 3 LD (Screw) IMPLANT
SCREW LOCK 14X2.7X 3 LD TPR (Screw) IMPLANT
SCREW LOCK 20X2.7X 3 LD TPR (Screw) IMPLANT
SCREW LOCKING 2.7X12MM (Screw) ×4 IMPLANT
SCREW LOCKING 2.7X13MM (Screw) ×1 IMPLANT
SCREW LOCKING 2.7X14 (Screw) ×6 IMPLANT
SCREW LOCKING 2.7X20MM (Screw) ×2 IMPLANT
SCRUB BETADINE 4OZ XXX (MISCELLANEOUS) ×2 IMPLANT
SOL PREP POV-IOD 4OZ 10% (MISCELLANEOUS) ×2 IMPLANT
SPLINT FIBERGLASS 3X12 (CAST SUPPLIES) ×1 IMPLANT
SPONGE LAP 4X18 RFD (DISPOSABLE) IMPLANT
SUT MNCRL AB 4-0 PS2 18 (SUTURE) ×2 IMPLANT
SUT PROLENE 3 0 PS 2 (SUTURE) IMPLANT
SUT PROLENE 4 0 PS 2 18 (SUTURE) ×4 IMPLANT
SUT VIC AB 3-0 FS2 27 (SUTURE) IMPLANT
SYR CONTROL 10ML LL (SYRINGE) IMPLANT
SYSTEM CHEST DRAIN TLS 7FR (DRAIN) ×2 IMPLANT
TOWEL OR 17X24 6PK STRL BLUE (TOWEL DISPOSABLE) ×2 IMPLANT
TOWEL OR 17X26 10 PK STRL BLUE (TOWEL DISPOSABLE) ×2 IMPLANT
TUBE CONNECTING 12X1/4 (SUCTIONS) ×2 IMPLANT
TUBE EVACUATION TLS (MISCELLANEOUS) ×3 IMPLANT
WATER STERILE IRR 1000ML POUR (IV SOLUTION) ×2 IMPLANT

## 2018-01-12 NOTE — Anesthesia Postprocedure Evaluation (Signed)
Anesthesia Post Note  Patient: Ashley Whitehead  Procedure(s) Performed: OPEN REDUCTION INTERNAL FIXATION (ORIF) WRIST FRACTURE (Left Wrist)     Patient location during evaluation: PACU Anesthesia Type: General Level of consciousness: awake and alert Pain management: pain level controlled Vital Signs Assessment: post-procedure vital signs reviewed and stable Respiratory status: spontaneous breathing, nonlabored ventilation, respiratory function stable and patient connected to nasal cannula oxygen Cardiovascular status: blood pressure returned to baseline and stable Postop Assessment: no apparent nausea or vomiting Anesthetic complications: no    Last Vitals:  Vitals:   01/12/18 2108 01/12/18 2109  BP: (!) 151/116 (!) 147/78  Pulse: 87 81  Resp: 18   Temp: 36.8 C   SpO2: 93% 91%    Last Pain:  Vitals:   01/12/18 2108  TempSrc: Oral  PainSc:                  Ashley Whitehead

## 2018-01-12 NOTE — Op Note (Signed)
See dictation [id number] ORIF Left wrist Fx Yusuf Yu MD

## 2018-01-12 NOTE — Transfer of Care (Signed)
Immediate Anesthesia Transfer of Care Note  Patient: Wonda HornerBrenda T Marner  Procedure(s) Performed: OPEN REDUCTION INTERNAL FIXATION (ORIF) WRIST FRACTURE (Left Wrist)  Patient Location: PACU  Anesthesia Type:GA combined with regional for post-op pain  Level of Consciousness: awake, alert , oriented and patient cooperative  Airway & Oxygen Therapy: Patient Spontanous Breathing and Patient connected to nasal cannula oxygen  Post-op Assessment: Report given to RN, Post -op Vital signs reviewed and stable and Patient moving all extremities  Post vital signs: Reviewed and stable  Last Vitals:  Vitals Value Taken Time  BP 185/127 01/12/2018  7:34 PM  Temp 36.6 C 01/12/2018  7:32 PM  Pulse 86 01/12/2018  7:38 PM  Resp 18 01/12/2018  7:38 PM  SpO2 99 % 01/12/2018  7:38 PM  Vitals shown include unvalidated device data.  Last Pain:  Vitals:   01/12/18 1445  TempSrc:   PainSc: 5       Patients Stated Pain Goal: 3 (01/12/18 1445)  Complications: No apparent anesthesia complications

## 2018-01-12 NOTE — Anesthesia Preprocedure Evaluation (Addendum)
Anesthesia Evaluation  Patient identified by MRN, date of birth, ID band Patient awake    Reviewed: Allergy & Precautions, NPO status , Patient's Chart, lab work & pertinent test results  History of Anesthesia Complications Negative for: history of anesthetic complications  Airway Mallampati: II  TM Distance: >3 FB Neck ROM: Full    Dental  (+) Teeth Intact, Dental Advisory Given   Pulmonary neg pulmonary ROS,    Pulmonary exam normal breath sounds clear to auscultation       Cardiovascular negative cardio ROS Normal cardiovascular exam Rhythm:Regular Rate:Normal     Neuro/Psych Anxiety Depression negative neurological ROS     GI/Hepatic Neg liver ROS, GERD  Controlled,  Endo/Other  Hypothyroidism   Renal/GU negative Renal ROS     Musculoskeletal negative musculoskeletal ROS (+)   Abdominal   Peds  Hematology negative hematology ROS (+)   Anesthesia Other Findings Day of surgery medications reviewed with the patient.  Reproductive/Obstetrics                            Anesthesia Physical Anesthesia Plan  ASA: II  Anesthesia Plan: General   Post-op Pain Management: GA combined w/ Regional for post-op pain   Induction: Intravenous  PONV Risk Score and Plan: 3 and Treatment may vary due to age or medical condition and Ondansetron  Airway Management Planned: LMA  Additional Equipment:   Intra-op Plan:   Post-operative Plan: Extubation in OR  Informed Consent: I have reviewed the patients History and Physical, chart, labs and discussed the procedure including the risks, benefits and alternatives for the proposed anesthesia with the patient or authorized representative who has indicated his/her understanding and acceptance.   Dental advisory given  Plan Discussed with:   Anesthesia Plan Comments: (NO DEXAMETHASONE.  )      Anesthesia Quick Evaluation

## 2018-01-12 NOTE — Progress Notes (Signed)
Anesthesia MD at the bedside to assess cough and hoarseness of patient.  Pt vomited during the procedure.  Will continue to monitor and notify MD for further changes.

## 2018-01-12 NOTE — Anesthesia Procedure Notes (Signed)
Procedure Name: Intubation Date/Time: 01/12/2018 6:07 PM Performed by: Kaylyn LayerHowze, Quitman Norberto E, MD Pre-anesthesia Checklist: Patient identified, Emergency Drugs available, Suction available and Patient being monitored Patient Re-evaluated:Patient Re-evaluated prior to induction Oxygen Delivery Method: Circle System Utilized Preoxygenation: Pre-oxygenation with 100% oxygen Induction Type: IV induction Ventilation: Mask ventilation without difficulty Laryngoscope Size: Glidescope and 3 Tube type: Oral Tube size: 7.0 mm Number of attempts: 1 Airway Equipment and Method: Oral airway and Video-laryngoscopy Placement Confirmation: ETT inserted through vocal cords under direct vision,  positive ETCO2 and breath sounds checked- equal and bilateral Secured at: 22 cm Tube secured with: Tape Dental Injury: Teeth and Oropharynx as per pre-operative assessment  Difficulty Due To: Difficulty was unanticipated Comments: Intubation after aspiration event. Atraumatic placement of ETT with no vomitus seen around cords. Bronchoscopy performed after intubation with no evidence of aspiration observed.

## 2018-01-12 NOTE — Anesthesia Procedure Notes (Signed)
Procedure Name: Intubation Date/Time: 01/12/2018 6:07 PM Performed by: Ponciano OrtBrewer, Meredith Kilbride, CRNA Pre-anesthesia Checklist: Patient identified, Emergency Drugs available, Suction available and Patient being monitored Patient Re-evaluated:Patient Re-evaluated prior to induction Oxygen Delivery Method: Circle system utilized Preoxygenation: Pre-oxygenation with 100% oxygen Induction Type: IV induction Ventilation: Mask ventilation without difficulty and Mask ventilation throughout procedure Laryngoscope Size: Glidescope Grade View: Grade I Tube size: 7.0 mm Number of attempts: 1 Airway Equipment and Method: Video-laryngoscopy and Stylet Placement Confirmation: ETT inserted through vocal cords under direct vision,  positive ETCO2 and breath sounds checked- equal and bilateral Secured at: 21 cm Tube secured with: Tape Dental Injury: Teeth and Oropharynx as per pre-operative assessment

## 2018-01-12 NOTE — Progress Notes (Signed)
Attempted report asked to call back in 5 minutes.

## 2018-01-12 NOTE — H&P (Signed)
Wonda HornerBrenda T Rester is an 72 y.o. female.   Chief Complaint: Left wrist fracture displaced HPI: Patient presents for ORIF left wrist fracture  Patient presents for evaluation and treatment of the of their upper extremity predicament. The patient denies neck, back, chest or  abdominal pain. The patient notes that they have no lower extremity problems. The patients primary complaint is noted. We are planning surgical care pathway for the upper extremity.  Past Medical History:  Diagnosis Date  . Anxiety   . Depression   . GERD (gastroesophageal reflux disease)   . Heart murmur    innocent  . Hypercholesterolemia   . Hypothyroidism   . Osteoporosis   . Renal infection   . Wrist fracture     Past Surgical History:  Procedure Laterality Date  . BREAST SURGERY     lumpectomy  . COLONOSCOPY    . FINGER SURGERY    . TONSILLECTOMY      Family History  Problem Relation Age of Onset  . Cancer Mother    Social History:  reports that she has never smoked. She has never used smokeless tobacco. She reports that she does not use drugs. Her alcohol history is not on file.  Allergies:  Allergies  Allergen Reactions  . Prednisone Other (See Comments)    Insomnia Overall no energy/lethargic   . Singulair [Montelukast Sodium] Other (See Comments)    Mood changes    Medications Prior to Admission  Medication Sig Dispense Refill  . alendronate (FOSAMAX) 70 MG tablet Take 70 mg by mouth once a week.    . ALPRAZolam (XANAX) 0.25 MG tablet Take 0.25 mg by mouth at bedtime as needed for anxiety.    Marland Kitchen. atorvastatin (LIPITOR) 20 MG tablet Take 20 mg by mouth daily.    . cetirizine (ZYRTEC) 10 MG tablet Take 10 mg by mouth daily.    . Cholecalciferol (VITAMIN D) 50 MCG (2000 UT) tablet Take 2,000 Units by mouth daily.    . Cyanocobalamin (VITAMIN B-12 PO) Take 1 tablet by mouth daily.    Marland Kitchen. levothyroxine (SYNTHROID, LEVOTHROID) 100 MCG tablet Take 100 mcg by mouth daily before breakfast.    .  meclizine (ANTIVERT) 25 MG tablet Take 25 mg by mouth 3 (three) times daily as needed for dizziness.    Marland Kitchen. PARoxetine (PAXIL) 40 MG tablet Take 40 mg by mouth daily.      Results for orders placed or performed during the hospital encounter of 01/12/18 (from the past 48 hour(s))  CBC     Status: None   Collection Time: 01/12/18  2:53 PM  Result Value Ref Range   WBC 9.2 4.0 - 10.5 K/uL   RBC 4.53 3.87 - 5.11 MIL/uL   Hemoglobin 12.7 12.0 - 15.0 g/dL   HCT 16.141.4 09.636.0 - 04.546.0 %   MCV 91.4 80.0 - 100.0 fL   MCH 28.0 26.0 - 34.0 pg   MCHC 30.7 30.0 - 36.0 g/dL   RDW 40.913.9 81.111.5 - 91.415.5 %   Platelets 226 150 - 400 K/uL   nRBC 0.0 0.0 - 0.2 %    Comment: Performed at [facility], [address] [place], [place] [address]   No results found.  Review of Systems  Respiratory: Negative.   Cardiovascular: Negative.   Gastrointestinal: Negative.   Genitourinary: Negative.     Blood pressure (!) 144/88, pulse 77, temperature 97.9 F (36.6 C), temperature source Oral, resp. rate 18, height 5\' 5"  (1.651 m), weight 70.3  kg, SpO2 97 %. Physical Exam  Left wrist fracture displaced intra-articular and with intact neurovascular exam.  Left elbow stable.  No signs of infection or dystrophy.  The patient is alert and oriented in no acute distress. The patient complains of pain in the affected upper extremity.  The patient is noted to have a normal HEENT exam. Lung fields show equal chest expansion and no shortness of breath. Abdomen exam is nontender without distention. Lower extremity examination does not show any fracture dislocation or blood clot symptoms. Pelvis is stable and the neck and back are stable and nontender. Assessment/Plan Displaced left wrist fracture we will plan for open reduction internal fixation and repair as necessary.  We are planning surgery for your upper extremity. The risk and benefits of surgery to include risk of bleeding, infection, anesthesia,  damage to normal  structures and failure of the surgery to accomplish its intended goals of relieving symptoms and restoring function have been discussed in detail. With this in mind we plan to proceed. I have specifically discussed with the patient the pre-and postoperative regime and the dos and don'ts and risk and benefits in great detail. Risk and benefits of surgery also include risk of dystrophy(CRPS), chronic nerve pain, failure of the healing process to go onto completion and other inherent risks of surgery The relavent the pathophysiology of the disease/injury process, as well as the alternatives for treatment and postoperative course of action has been discussed in great detail with the patient who desires to proceed.  We will do everything in our power to help you (the patient) restore function to the upper extremity. It is a pleasure to see this patient today.   Oletta Cohn III, MD 01/12/2018, 5:55 PM

## 2018-01-12 NOTE — Anesthesia Procedure Notes (Signed)
Anesthesia Regional Block: Supraclavicular block   Pre-Anesthetic Checklist: ,, timeout performed, Correct Patient, Correct Site, Correct Laterality, Correct Procedure, Correct Position, site marked, Risks and benefits discussed, pre-op evaluation,  At surgeon's request and post-op pain management  Laterality: Left  Prep: Maximum Sterile Barrier Precautions used, chloraprep       Needles:  Injection technique: Single-shot  Needle Type: Echogenic Stimulator Needle     Needle Length: 9cm  Needle Gauge: 22     Additional Needles:   Procedures:,,,, ultrasound used (permanent image in chart),,,,  Narrative:  Start time: 01/12/2018 5:26 PM End time: 01/12/2018 5:30 PM Injection made incrementally with aspirations every 5 mL.  Performed by: Personally  Anesthesiologist: Kaylyn LayerHowze, Bryona Foxworthy E, MD  Additional Notes: Risks, benefits, and alternative discussed. Patient gave consent for procedure. Patient prepped and draped in sterile fashion. Sedation administered, patient remains easily responsive to voice. Relevant anatomy identified with ultrasound guidance. Local anesthetic given in 5cc increments with no signs or symptoms of intravascular injection. No pain or paraesthesias with injection. Patient monitored throughout procedure with signs of LAST or immediate complications. Tolerated well. Ultrasound image placed in chart.  Ashley GreenhouseKE Correne Lalani, MD

## 2018-01-13 ENCOUNTER — Encounter (HOSPITAL_COMMUNITY): Payer: Self-pay | Admitting: Orthopedic Surgery

## 2018-01-13 DIAGNOSIS — S52532A Colles' fracture of left radius, initial encounter for closed fracture: Secondary | ICD-10-CM | POA: Diagnosis not present

## 2018-01-13 MED ORDER — ETOMIDATE 2 MG/ML IV SOLN
INTRAVENOUS | Status: AC
  Filled 2018-01-13: qty 10

## 2018-01-13 NOTE — Plan of Care (Signed)

## 2018-01-13 NOTE — Progress Notes (Signed)
AVS given and reviewed with pt. All questions answered to satisfaction. Pt escorted off the unit via wheelchair by staff member.  

## 2018-01-13 NOTE — Evaluation (Signed)
Occupational Therapy Evaluation Patient Details Name: Ashley Whitehead MRN: [id number] DOB: 12/11/1945 Today's Date: 01/13/2018    History of Present Illness Pt is a 72 y/o female S/p ORIF left distal radius fracture. Pt has a PMH including Anxiety, Depression, GERD, Heart murmur, Hypercholesterolemia, Hypothyroidism, Osteoporosis, Renal infection, and Wrist fracture.   Clinical Impression   PTA Pt independent in ADL and mobility. Pt is currently experiencing dizziness with movement and positional changes/transfers so min guard assist for transfers for safety. Pt has been completing ADL at home since 11/29. Educated in compensatory strategies as well as safety (with new dizziness important to have 24 hour supervision - esp in shower) Established HEP for Patient's LUE (see below) and educated on elevation, ice, and ROM for edema management. OT complete acutely - Please continue further OT/PT for LUE as ordered by MD at follow up.     Follow Up Recommendations  Follow surgeon's recommendation for DC plan and follow-up therapies;Supervision/Assistance - 24 hour(initially)    Equipment Recommendations  None recommended by OT    Recommendations for Other Services       Precautions / Restrictions Precautions Precautions: Fall Required Braces or Orthoses: Splint/Cast Splint/Cast: LUE Restrictions Weight Bearing Restrictions: No      Mobility Bed Mobility Overal bed mobility: Needs Assistance Bed Mobility: Supine to Sit     Supine to sit: Supervision     General bed mobility comments: OOB in the recliner at beginning and end of session  Transfers Overall transfer level: Needs assistance Equipment used: None Transfers: Sit to/from Stand Sit to Stand: Min guard         General transfer comment: hands on min guard for safety    Balance Overall balance assessment: Mild deficits observed, not formally tested                                         ADL either  performed or assessed with clinical judgement   ADL Overall ADL's : At baseline                                       General ADL Comments: Pt has been functioning at home, dressing LUE first, able to manage things like toothbrush and other BUE tasks- or her husband performs for her. Educated that she needs to have someone with her whenever she is up due to dizziness/unsteady - ESP in the shower. Educated in compensatory strategies for ADL as well.     Vision         Perception     Praxis      Pertinent Vitals/Pain Pain Assessment: Faces Faces Pain Scale: Hurts a little bit Pain Location: left wrist with movement, left side of body (generalized soreness) Pain Descriptors / Indicators: Sore;Guarding;Pressure Pain Intervention(s): Monitored during session;Other (comment)(educated on ROM, elevation, ice)     Hand Dominance Right   Extremity/Trunk Assessment Upper Extremity Assessment Upper Extremity Assessment: LUE deficits/detail LUE Deficits / Details: not quite full extension at elbow, ROM at fingers as much as splint allowed - educated to focus on extension as well as flexion, shoulder WFL LUE Coordination: decreased fine motor;decreased gross motor   Lower Extremity Assessment Lower Extremity Assessment: Defer to PT evaluation LLE Deficits / Details: s/p left ankle sprain sustained from fall, some pain/discomfort in left  ankle with stair mobility.    Cervical / Trunk Assessment Cervical / Trunk Assessment: Normal   Communication Communication Communication: No difficulties   Cognition Arousal/Alertness: Awake/alert Behavior During Therapy: WFL for tasks assessed/performed Overall Cognitive Status: Within Functional Limits for tasks assessed                                     General Comments  Edema present in LUE. Pt reports dizziness with positional changes that started Saturday. Vestibular PT consulted as signs and symptoms were  indicative of potential vertigo. She reports this happened several years ago and was seen by a doctor to treat it.     Exercises Exercises: General Upper Extremity General Exercises - Upper Extremity Shoulder Flexion: AROM;Left;10 reps;Seated Shoulder ABduction: AROM;Left;10 reps;Seated Shoulder Horizontal ABduction: AROM;Left;10 reps;Seated Elbow Flexion: AROM;Left;10 reps;Seated;Other (comment) Elbow Extension: AROM;Left;10 reps;Seated(ROM approx 150 today) Digit Composite Flexion: AROM;Left;10 reps(to limits of spint) Composite Extension: AROM;Left;10 reps(to limits of splint)   Shoulder Instructions      Home Living Family/patient expects to be discharged to:: Private residence Living Arrangements: Spouse/significant other Available Help at Discharge: Family Type of Home: House Home Access: Stairs to enter [employer] of Steps: 2 Entrance Stairs-Rails: Can reach both Home Layout: Two level;Able to live on main level with bedroom/bathroom     Bathroom Shower/Tub: Producer, television/film/video: Handicapped height     Home Equipment: Grab bars - toilet   Additional Comments: Lives with husband and dog, husband available to assist as needed      Prior Functioning/Environment Level of Independence: Independent        Comments: pt and spouse very active, enjoy walking and swimming        OT Problem List: Decreased range of motion;Decreased strength;Impaired balance (sitting and/or standing);Impaired UE functional use;Pain      OT Treatment/Interventions:      OT Goals(Current goals can be found in the care plan section) Acute Rehab OT Goals Patient Stated Goal: get better for son's wedding this coming weekend OT Goal Formulation: With patient Time For Goal Achievement: 01/27/18 Potential to Achieve Goals: Good  OT Frequency:     Barriers to D/C:            Co-evaluation              AM-PAC OT "6 Clicks" Daily Activity     Outcome  Measure Help from another person eating meals?: A Little Help from another person taking care of personal grooming?: A Little Help from another person toileting, which includes using toliet, bedpan, or urinal?: A Little Help from another person bathing (including washing, rinsing, drying)?: A Little Help from another person to put on and taking off regular upper body clothing?: A Little Help from another person to put on and taking off regular lower body clothing?: A Little 6 Click Score: 18   End of Session Equipment Utilized During Treatment: Gait belt Nurse Communication: Mobility status;Precautions  Activity Tolerance: Patient tolerated treatment well Patient left: in chair;with call bell/phone within reach;with chair alarm set  OT Visit Diagnosis: Unsteadiness on feet (R26.81);Other abnormalities of gait and mobility (R26.89);History of falling (Z91.81);Dizziness and giddiness (R42);Pain Pain - Right/Left: Left Pain - part of body: Arm                Time: 0950-1010 OT Time Calculation (min): 20 min Charges:  OT General Charges $OT Visit:  1 Visit OT Evaluation $OT Eval Moderate Complexity: 1 Mod  Sherryl Manges OTR/L Acute Rehabilitation Services Pager: [phone] Office: [phone]  Ashley Whitehead 01/13/2018, 10:46 AM

## 2018-01-13 NOTE — Op Note (Signed)
NAME: Ashley Whitehead, [name] MEDICAL RECORD [id number] ACCOUNT [id number] DATE OF BIRTH:Nov 08, 1945 FACILITY: [facility] LOCATION: [facility] PHYSICIAN:Tayra Dawe M. Lavaun Greenfield, MD  OPERATIVE REPORT  DATE OF PROCEDURE:  01/12/2018  PREOPERATIVE DIAGNOSIS:  Comminuted complex distal radius and distal ulnar styloid tip fracture.  POSTOPERATIVE DIAGNOSIS:  Comminuted complex distal radius and distal ulnar styloid tip fracture.  PROCEDURE: 1.  Open reduction internal fixation with DVR extended volar rim plate, left distal radius fracture. 2.  Four-view radiographic series left wrist.  SURGEON:  Dominica SeverinWilliam Hampton Cost, MD  ASSISTANT:  None.  COMPLICATIONS:  None.  ANESTHESIA:  General with block.  TOURNIQUET TIME:  Less than an hour.  DRAINS:  One.  INDICATIONS:  Pleasant 72 year old female with the above-mentioned diagnosis.  I have counseled her in regards to risks and benefits of surgery and she desires to proceed.  All questions have been encouraged and answered preoperatively.  DESCRIPTION OF PROCEDURE:  The patient was seen by myself and anesthesia and taken to the operative theater and underwent smooth induction of general anesthesia.  She had concerns over a small aspiration, which was attended to by the anesthesia  department.  There is nothing on bronchoscopy in her lung fields according to anesthesia report.  The operation then went forward with Hibiclens scrub x2 to the arm followed by Betadine scrub and paint.  Outline marks were made.  Incision was made volar  radially and dissection was carried down without difficulty.  Dissection was carried down to the FCR sheath, which was incised dorsally and palmarly.  Dissection then ensued and carpal canal contents were retracted ulnarly.  Pronator was incised.   Fracture was identified, reduced, and an extended rim volar rim DVR plate was applied.  I was able to achieve the best height, inclination and volar tilt possible given the comminution.  Plate was  applied.  I checked the radiocarpal and mid carpal and  distal radioulnar joint mechanics, which were stable.  The patient did have an ulnar styloid fracture; however, this was stable to ligamentous testing on the table.  I repaired the pronator and closed the wound over a TLS drain and did this with the  tourniquet deflated.  She had excellent refill.  No complications.  We will admit her for IV antibiotics, general postoperative observation and other measures.  We will see her back and proceed according to our standard DVR postop protocol.  These notes  have been discussed and all questions have been encouraged and answered.  TN/NUANCE  D:01/12/2018 T:01/13/2018 JOB:[id number]

## 2018-01-13 NOTE — Progress Notes (Signed)
Physical Therapy Treatment Patient Details Name: Ashley Whitehead MRN: [id number] DOB: 12/06/1945 Today's Date: 01/13/2018    History of Present Illness Pt is a [age] y/o female S/p ORIF left distal radius fracture. Pt has a PMH including Anxiety, Depression, GERD, Heart murmur, Hypercholesterolemia, Hypothyroidism, Osteoporosis, Renal infection, and Wrist fracture.    PT Comments    Patient seen for vestibular assessment. Signs and symptoms are consistent with R horizontal BPPV. No central findings. CRM for R lateral canal attempted but pt faced difficulty due to recent LUE ORIF. Supine roll  Re-test negative after BBQ roll, positive for R posterior canal. Epley then performed. Extensive education and all answered questions.  Rec OP NEURO PT for follow up and resolution of symptoms.    Follow Up Recommendations   OUTPATIENT NEURO PT FOR VESTIBULAR REHAB     Equipment Recommendations       Recommendations for Other Services      01/13/18 0001  Vestibular Assessment  General Observation pt reports dizziness with head motoins that last 20-40 seconds and stop when she keeps her head at rest. they began last sunday after fall on Friday.   Symptom Behavior  Type of Dizziness Spinning  Duration of Dizziness 30 sec  Aggravating Factors  (moving head)  Relieving Factors Head stationary  Occulomotor Exam  Occulomotor Alignment Normal  Spontaneous Absent  Gaze-induced Absent  Head shaking Horizontal Absent  Head Shaking Vertical Absent  Smooth Pursuits Intact  Saccades Intact  Positional Testing  Dix-Hallpike Dix-Hallpike Right;Dix-Hallpike Left  Horizontal Canal Testing Horizontal Canal Right;Horizontal Canal Left;Horizontal Canal Right Intensity;Horizontal Canal Left Intensity  Dix-Hallpike Right  Dix-Hallpike Right Duration 30  Dix-Hallpike Right Symptoms No nystagmus  Dix-Hallpike Left  Dix-Hallpike Left Duration 30  Dix-Hallpike Left Symptoms No nystagmus  Horizontal Canal Right   Horizontal Canal Right Duration 45  Horizontal Canal Right Symptoms Geotrophic;Nystagmus (more intense than the L)  Horizontal Canal Left  Horizontal Canal Left Duration 45  Horizontal Canal Left Symptoms Geotrophic  Horizontal Canal Right Intensity  Horizontal Canal Right Intensity Severe  Horizontal Canal Left Intensity  Horizontal Canal Left Intensity Mild  Cognition  Cognition Orientation Level Appropriate for developmental age      Precautions / Restrictions Precautions Precautions: Fall Required Braces or Orthoses: Splint/Cast Splint/Cast: LUE Restrictions Weight Bearing Restrictions: No    Mobility  Bed Mobility Overal bed mobility: Needs Assistance Bed Mobility: Supine to Sit              Transfers                    Ambulation/Gait                 Stairs             Wheelchair Mobility    Modified Rankin (Stroke Patients Only)       Balance                                            Cognition Arousal/Alertness: Awake/alert Behavior During Therapy: WFL for tasks assessed/performed Overall Cognitive Status: Within Functional Limits for tasks assessed                                        Exercises  General Comments General comments (skin integrity, edema, etc.): discussion over findings and course of care, expectations, recomendations.       Pertinent Vitals/Pain Pain Assessment: Faces Faces Pain Scale: Hurts a little bit Pain Location: left wrist with movement, left side of body (generalized soreness) Pain Descriptors / Indicators: Sore;Guarding;Pressure    Home Living                      Prior Function            PT Goals (current goals can now be found in the care plan section) Acute Rehab PT Goals Patient Stated Goal: find out why dizzy PT Goal Formulation: With patient Time For Goal Achievement: 01/27/18 Potential to Achieve Goals: Good     Frequency           PT Plan      Co-evaluation              AM-PAC PT "6 Clicks" Mobility   Outcome Measure                   End of Session   Activity Tolerance: Patient tolerated treatment well     PT Visit Diagnosis: Other abnormalities of gait and mobility (R26.89);Muscle weakness (generalized) (M62.81);History of falling (Z91.81);Pain Pain - Right/Left: Left Pain - part of body: Arm;Hand     Time: 1210-1310 PT Time Calculation (min) (ACUTE ONLY): 60 min  Charges:  $Self Care/Home Management: 8-22 $Canalith Rep Proc: 8-22 mins                     Etta Grandchild, PT, DPT Acute Rehabilitation Services Pager: [phone] Office: [phone]    Etta Grandchild 01/13/2018, 3:50 PM

## 2018-01-13 NOTE — Discharge Instructions (Signed)

## 2018-01-13 NOTE — Discharge Summary (Signed)
Physician Discharge Summary  Patient ID: Ashley Whitehead Himebaugh MRN: [id number] DOB/AGE: 72/10/1945 72 y.o.  Admit date: 01/12/2018 Discharge date:   Admission Diagnoses: Left wrist fracture Past Medical History:  Diagnosis Date  . Anxiety   . Depression   . GERD (gastroesophageal reflux disease)   . Heart murmur    innocent  . Hypercholesterolemia   . Hypothyroidism   . Osteoporosis   . Renal infection   . Wrist fracture     Discharge Diagnoses:  Active Problems:   Colles' fracture of left radius, initial encounter for closed fracture   Surgeries: Procedure(s): OPEN REDUCTION INTERNAL FIXATION (ORIF) WRIST FRACTURE on 01/12/2018    Consultants:   Discharged Condition: Improved  Hospital Course: Ashley Whitehead Cieslik is an 72 y.o. female who was admitted 01/12/2018 with a chief complaint of No chief complaint on file. , and found to have a diagnosis of Left wrist fracture.  They were brought to the operating room on 01/12/2018 and underwent Procedure(s): OPEN REDUCTION INTERNAL FIXATION (ORIF) WRIST FRACTURE.    They were given perioperative antibiotics:  Anti-infectives (From admission, onward)   Start     Dose/Rate Route Frequency Ordered Stop   01/13/18 0600  ceFAZolin (ANCEF) IVPB 1 g/50 mL premix     1 g 100 mL/hr over 30 Minutes Intravenous Every 8 hours 01/12/18 2053     01/13/18 0000  ceFAZolin (ANCEF) IVPB 1 g/50 mL premix     1 g 100 mL/hr over 30 Minutes Intravenous  Once 01/12/18 2053 01/12/18 2300   01/12/18 1430  ceFAZolin (ANCEF) IVPB 2g/100 mL premix     2 g 200 mL/hr over 30 Minutes Intravenous On call to O.R. 01/12/18 1409 01/12/18 1816    .  They were given sequential compression devices, early ambulation, and Other (comment) for DVT prophylaxis.  Recent vital signs:  Patient Vitals for the past 24 hrs:  BP Temp Temp src Pulse Resp SpO2 Height Weight  01/13/18 0516 113/71 98 F (36.7 C) Oral 79 20 90 % - -  01/12/18 2343 (!) 142/72 98.2 F (36.8 C) Oral 94  18 91 % - -  01/12/18 2109 (!) 147/78 - - 81 - 91 % - -  01/12/18 2108 (!) 151/116 98.2 F (36.8 C) Oral 87 18 93 % - -  01/12/18 2030 98/77 - - 81 15 97 % - -  01/12/18 2020 (!) 146/114 - - 80 (!) 25 100 % - -  01/12/18 2015 - - - 81 14 100 % - -  01/12/18 2000 - - - 76 (!) 21 100 % - -  01/12/18 1945 - - - 81 (!) 21 100 % - -  01/12/18 1932 (!) 185/127 97.9 F (36.6 C) - 95 19 (!) 88 % - -  01/12/18 1421 (!) 144/88 97.9 F (36.6 C) Oral 77 18 97 % 5\' 5"  (1.651 m) 70.3 kg  .  Recent laboratory studies: No results found.  Discharge Medications:   Allergies as of 01/13/2018      Reactions   Prednisone Other (See Comments)   Insomnia Overall no energy/lethargic   Singulair [montelukast Sodium] Other (See Comments)   Mood changes      Medication List    TAKE these medications   alendronate 70 MG tablet Commonly known as:  FOSAMAX Take 70 mg by mouth once a week.   ALPRAZolam 0.25 MG tablet Commonly known as:  XANAX Take 0.25 mg by mouth at bedtime as needed for anxiety.  atorvastatin 20 MG tablet Commonly known as:  LIPITOR Take 20 mg by mouth daily.   cetirizine 10 MG tablet Commonly known as:  ZYRTEC Take 10 mg by mouth daily.   levothyroxine 100 MCG tablet Commonly known as:  SYNTHROID, LEVOTHROID Take 100 mcg by mouth daily before breakfast.   meclizine 25 MG tablet Commonly known as:  ANTIVERT Take 25 mg by mouth 3 (three) times daily as needed for dizziness.   PARoxetine 40 MG tablet Commonly known as:  PAXIL Take 40 mg by mouth daily.   VITAMIN B-12 PO Take 1 tablet by mouth daily.   Vitamin D 50 MCG (2000 UT) tablet Take 2,000 Units by mouth daily.       Diagnostic Studies: Mm 3d Screen Breast Bilateral  Result Date: 12/17/2017 CLINICAL DATA:  Screening. EXAM: DIGITAL SCREENING BILATERAL MAMMOGRAM WITH TOMO AND CAD COMPARISON:  Previous exam(s). ACR Breast Density Category c: The breast tissue is heterogeneously dense, which may obscure small  masses. FINDINGS: There are no findings suspicious for malignancy. Images were processed with CAD. IMPRESSION: No mammographic evidence of malignancy. A result letter of this screening mammogram will be mailed directly to the patient. RECOMMENDATION: Screening mammogram in one year. (Code:SM-B-01Y) BI-RADS CATEGORY  1: Negative. Electronically Signed   By: Annia Belt M.D.   On: 12/17/2017 16:45    They benefited maximally from their hospital stay and there were no complications.     Disposition: Discharge disposition: 01-Home or Self Care      Discharge Instructions    Call MD / Call 911   Complete by:  As directed    If you experience chest pain or shortness of breath, CALL 911 and be transported to the hospital emergency room.  If you develope a fever above 101 F, pus (white drainage) or increased drainage or redness at the wound, or calf pain, call your surgeon's office.   Constipation Prevention   Complete by:  As directed    Drink plenty of fluids.  Prune juice may be helpful.  You may use a stool softener, such as Colace (over the counter) 100 mg twice a day.  Use MiraLax (over the counter) for constipation as needed.   Diet - low sodium heart healthy   Complete by:  As directed    Increase activity slowly as tolerated   Complete by:  As directed      Follow-up Information    Dominica Severin, MD Follow up in 14 day(s).   Specialty:  Orthopedic Surgery Why:  We will call for your follow-up appointment in 14 days Contact information: [address] [place] [address] [phone]          Status post open reduction internal fixation left distal radius fracture doing well.  Patient will be discharged home today.  She will follow-up in 14 days.  She has no signs of infection DVT or urinary tract infection at this juncture and is doing quite well. Signed: Dionne Ano Alfred Harrel III 01/13/2018, 6:51 AM

## 2018-01-13 NOTE — Progress Notes (Addendum)
Physical Therapy Evaluation   Clinical Impression PTA pt lived an independent, active lifestyle with husband, enjoying walking and swimming. Pt eager to work with therapy and return home, completing bed mobility with supervision and all other mobility hands on min guard for safety, without use of AD. Pt reports some dizziness with positional changes which has occurred once before several years ago and was treated by doctor, vestibular PT referral was made. Pt completed stair mobility hands on min guard for safety, vc to change alternating step pattern to a step-to pattern for safety as she reported she sprained her left ankle during the fall and has intermittent pain with stair navigation. PT recommending no additional therapy at DC at this time, as pt is able to complete functional mobility safely. PT will continue to follow acutely.     01/13/18 0900  PT Visit Information  Last PT Received On 01/13/18  Assistance Needed +1  History of Present Illness Pt is a 72 y/o female S/p ORIF left distal radius fracture sustained from a fall. Pt has a PMH including Anxiety, Depression, GERD, Heart murmur, Hypercholesterolemia, Hypothyroidism, Osteoporosis, Renal infection, and Wrist fracture.  Precautions  Precautions Fall  Required Braces or Orthoses Splint/Cast  Splint/Cast LUE  Restrictions  Weight Bearing Restrictions No  Home Living  Family/patient expects to be discharged to: Private residence  Living Arrangements Spouse/significant other  Available Help at Discharge Family  Type of Home House  Home Access Stairs to enter  Entrance Stairs-Number of Steps 2  Entrance Stairs-Rails Can reach both  Home Layout Two level;Able to live on main level with bedroom/bathroom  Catering manager Handicapped height  Home Equipment Grab bars - toilet  Additional Comments Lives with husband and dog, husband available to assist as needed  Prior Function  Level of  Independence Independent  Comments pt and spouse very active, enjoy walking and swimming  Communication  Communication No difficulties  Pain Assessment  Pain Assessment Faces  Faces Pain Scale 2  Pain Location left wrist with movement, left side of body (generalized soreness), left ankle with stairs  Pain Descriptors / Indicators Sore;Guarding;Pressure  Pain Intervention(s) Limited activity within patient's tolerance;Monitored during session;Repositioned  Cognition  Arousal/Alertness Awake/alert  Behavior During Therapy WFL for tasks assessed/performed  Overall Cognitive Status Within Functional Limits for tasks assessed  Upper Extremity Assessment  Upper Extremity Assessment Defer to OT evaluation  Lower Extremity Assessment  Lower Extremity Assessment LLE deficits/detail;Generalized weakness  LLE Deficits / Details s/p left ankle sprain sustained from fall, some pain/discomfort in left ankle with stair mobility.   Cervical / Trunk Assessment  Cervical / Trunk Assessment Normal  Bed Mobility  Overal bed mobility Needs Assistance  Bed Mobility Supine to Sit  Supine to sit Supervision  General bed mobility comments supervision for safety, pt able to complete without use of bedrail or HOB elevated  Transfers  Overall transfer level Needs assistance  Equipment used None  Transfers Sit to/from Stand  Sit to Stand Min guard  General transfer comment hands on min guard for safety  Ambulation/Gait  Ambulation/Gait assistance Supervision;Min guard  Gait Distance (Feet) 570 Feet  Assistive device None  Gait Pattern/deviations Step-through pattern  General Gait Details pt ambulating hands on min guard progressing to supervision for safety, with left UE held in guarded, elbow flexed positioning.   Gait velocity interpretation >4.37 ft/sec, indicative of normal walking speed  Stairs Yes  Stairs assistance Min guard  Stair Management One rail Right;Alternating  pattern;Step to pattern   Number of Stairs 4  General stair comments hands on min guard for safety, pt completed stairs with step-through pattern. while descending steps, pt felt a pain in her left ankle that she said feels "like it catches" and reports she sprained her left ankle during the fall as well, and has a brace at home that she has been wearing. vc to take steps slower, using step-to pattern until left ankle feels more stable  Balance  Overall balance assessment Mild deficits observed, not formally tested  General Comments  General comments (skin integrity, edema, etc.) Edema present in LUE. Pt reports dizziness with positional changes that started Saturday.  Detected right-beating nystagmus with left head rotation. Vestibular PT consulted as signs and symptoms were indicative of potential vertigo. She reports this happened several years ago and was seen by a doctor to treat it.   PT - End of Session  Equipment Utilized During Treatment Gait belt  Activity Tolerance Patient tolerated treatment well  Patient left in chair;with call bell/phone within reach;with chair alarm set  Nurse Communication Mobility status;Other (comment) (PT to return for vestibular consult)  PT Assessment  PT Recommendation/Assessment Patient needs continued PT services  PT Visit Diagnosis Other abnormalities of gait and mobility (R26.89);Muscle weakness (generalized) (M62.81);History of falling (Z91.81);Pain  Pain - Right/Left Left  Pain - part of body Arm;Hand  PT Problem List Decreased strength;Decreased range of motion;Decreased activity tolerance;Decreased balance;Decreased mobility;Pain  PT Plan  PT Frequency (ACUTE ONLY) Min 5X/week  PT Treatment/Interventions (ACUTE ONLY) DME instruction;Gait training;Stair training;Functional mobility training;Therapeutic activities;Therapeutic exercise;Balance training;Neuromuscular re-education;Patient/family education  AM-PAC PT "6 Clicks" Mobility Outcome Measure (Version 2)  Help needed  turning from your back to your side while in a flat bed without using bedrails? 4  Help needed moving from lying on your back to sitting on the side of a flat bed without using bedrails? 4  Help needed moving to and from a bed to a chair (including a wheelchair)? 4  Help needed standing up from a chair using your arms (e.g., wheelchair or bedside chair)? 4  Help needed to walk in hospital room? 4  Help needed climbing 3-5 steps with a railing?  3  6 Click Score 23  Consider Recommendation of Discharge To: Home with no services  PT Recommendation  Follow Up Recommendations No PT follow up;Supervision for mobility/OOB  PT equipment None recommended by PT  Individuals Consulted  Consulted and Agree with Results and Recommendations Patient  Acute Rehab PT Goals  Patient Stated Goal get better for son's wedding next weekend  PT Goal Formulation With patient  Time For Goal Achievement 01/27/18  Potential to Achieve Goals Good  PT Time Calculation  PT Start Time (ACUTE ONLY) 0854  PT Stop Time (ACUTE ONLY) 0925  PT Time Calculation (min) (ACUTE ONLY) 31 min  PT General Charges  $$ ACUTE PT VISIT 1 Visit  PT Evaluation  $PT Eval Low Complexity 1 Low  PT Treatments  $Gait Training 8-22 mins  Written Expression  Dominant Hand Right    Ashley Whitehead, SPT Acute Rehabilitation Services Office [phone]

## 2018-01-13 NOTE — Progress Notes (Signed)
Patient stated that oxycodone causes her to itch and that was given hydrocodone at home instead and didn't have a reaction to it.

## 2018-01-13 NOTE — Progress Notes (Signed)
Patient ID: Ashley HornerBrenda T Boisclair, female   DOB: 08/08/1945, 72 y.o.   MRN: [id number] Patient has been seen and examined. Patient has pain appropriate to his injury/process. Patient denies new complaints at this present time. I have discussed the care pathway with nursing staff. Patient is appropriate and alert.  We reviewed vital signs and intake output which are stable.  The upper extremity is neurovascularly intact. Refill is normal. There is no signs of compartment syndrome. There is no signs of dystrophy. There is normal sensation.  I have spent a  great deal of time discussing range of motion edema control and other techniques to decrease edema and promote flexion extension of the fingers. Patient understands the importance of elevation range of motion massage and other measures to lessen pain and prevent swelling.  We have also discussed immobilization to appropriate areas involved.  We have discussed with the patient shoulder range of motion to prevent adhesive capsulitis.  The remainder of the examination is normal today without complicating feature.  Drain was removed without difficulty  Patient will be discharged home. Will plan to see the patient back in the office as per discharge instructions (please see discharge instructions).  Patient had an uneventful hospital course. At the time of discharge patient is stable awake alert and oriented in no acute distress. Regular diet will be continued and has been tolerated. Patient will notify should have problems occur. There is no signs of DVT infection or other complication at this juncture.  All questions have been incurred and answered.  Please see discharge med list  Arbie Reisz MD

## 2018-01-13 NOTE — Plan of Care (Signed)

## 2018-01-13 NOTE — Progress Notes (Signed)
Pt expressed concern "feels like there is a blister under my cast". Amanda PeaGramig, MD aware. Pt informed. Will continue to monitor.

## 2018-05-28 ENCOUNTER — Telehealth: Payer: Self-pay

## 2018-05-28 NOTE — Telephone Encounter (Signed)
Left message for patient to call back. Will discuss patient's visit being a virtual visit or see if we need to reschedule.

## 2018-05-30 NOTE — Progress Notes (Signed)
Virtual Visit via Video Note   This visit type was conducted due to national recommendations for restrictions regarding the COVID-19 Pandemic (e.g. social distancing) in an effort to limit this patient's exposure and mitigate transmission in our community.  Due to her co-morbid illnesses, this patient is at least at moderate risk for complications without adequate follow up.  This format is felt to be most appropriate for this patient at this time.  All issues noted in this document were discussed and addressed.  A limited physical exam was performed with this format.  Please refer to the patient's chart for her consent to telehealth for [facility].   Evaluation Performed:  Follow-up visit  Date:  06/02/2018   ID:  Calla KicksBrenda T Stradley, DOB 12/08/1945, MRN [id number]  Patient Location: Home Provider Location: Office  PCP:  Macy MisBriscoe, Kim K, MD  Cardiologist:  Eden EmmsNishan Electrophysiologist:  None   Chief Complaint:  Syncope , Dizziness   History of Present Illness:    Ashley Whitehead is a 73 y.o. female referred by Delton CoombesKim Briscoe [facility] for dizziness / syncope. 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    Past Medical History:  Diagnosis Date  . Anxiety   . Depression   . GERD (gastroesophageal reflux disease)   . Heart murmur    innocent  . Hypercholesterolemia   . Hypothyroidism   . Osteoporosis   . Renal infection   . Wrist fracture    Past Surgical History:  Procedure Laterality Date   . BREAST SURGERY     lumpectomy  . COLONOSCOPY    . FINGER SURGERY    . ORIF WRIST FRACTURE Left 01/12/2018   Procedure: OPEN REDUCTION INTERNAL FIXATION (ORIF) WRIST FRACTURE;  Surgeon: Dominica SeverinGramig, William, MD;  Location: [facility] OR;  Service: Orthopedics;  Laterality: Left;  60 mins General with block  . TONSILLECTOMY       Current Meds  Medication Sig  . alendronate (FOSAMAX) 70 MG tablet Take 70 mg by mouth once a week.  . ALPRAZolam (XANAX) 0.25 MG tablet Take 0.25 mg by mouth at bedtime as needed for anxiety.  Marland Kitchen. atorvastatin (LIPITOR) 20 MG tablet Take 20 mg by mouth daily.  . cetirizine (ZYRTEC) 10 MG tablet Take 10 mg by mouth daily.  . Cholecalciferol (VITAMIN D) 50 MCG (2000 UT) tablet Take 2,000 Units by mouth daily.  . Cyanocobalamin (VITAMIN B-12 PO) Take 1 tablet by mouth daily.  Marland Kitchen. levothyroxine (SYNTHROID, LEVOTHROID) 100 MCG tablet Take 100 mcg by mouth daily before breakfast.  . meclizine (ANTIVERT) 25 MG tablet Take 25 mg by mouth 3 (three) times daily as needed for dizziness.  Marland Kitchen. PARoxetine (PAXIL) 40 MG tablet Take 40 mg by mouth daily.     Allergies:   Prednisone and Singulair [montelukast sodium]   Social History   Tobacco Use  . Smoking status: Never Smoker  . Smokeless tobacco: Never Used  Substance Use Topics  . Alcohol use: Not on file    Comment: rare   .  Drug use: Never     Family Hx: The patient's family history includes Cancer in her mother.  ROS:   Please see the history of present illness.     All other systems reviewed and are negative.   Prior CV studies:   The following studies were reviewed today:  Notes from [facility] telemetry, ECG, CXR CT and labs   Labs/Other Tests and Data Reviewed:    ECG:   NSR rate 79 low voltage otherwise normal   Recent Labs: 01/12/2018: Hemoglobin 12.7; Platelets 226   Recent Lipid Panel No results found for: CHOL, TRIG, HDL, CHOLHDL, LDLCALC, LDLDIRECT  Wt Readings from Last 3 Encounters:  06/02/18 70.3 kg   01/12/18 70.3 kg     Objective:    Vital Signs:  BP 122/76   Ht 5\' 5"  (1.651 m)   Wt 70.3 kg   BMI 25.79 kg/m    Skin warm and dry Previous right styloid fracture No distress No tachypnea No JVP elevation No edema   ASSESSMENT & PLAN:    1. Dizziness:  Consistent with vertigo low likelihood of cardiac etiology will order TTE to r/o structural heart disease no indication for monitor or stress testing without palpitations or chest pain  2. Murmur:  History of see above TTE 3. Thyroid:  On replacement TSH normal  4. HLD:  On statin labs with primary   COVID-19 Education: The signs and symptoms of COVID-19 were discussed with the patient and how to seek care for testing (follow up with PCP or arrange E-visit).  The importance of social distancing was discussed today.  Time:   Today, I have spent 30 minutes with the patient with telehealth technology discussing the above problems.     Medication Adjustments/Labs and Tests Ordered: Current medicines are reviewed at length with the patient today.  Concerns regarding medicines are outlined above.   Tests Ordered:  Echo for Dizziness/Syncope  Medication Changes: No orders of the defined types were placed in this encounter.   Disposition:  Follow up PRN if echo normal   Signed, Charlton Haws, MD  06/02/2018 9:38 AM    [facility]

## 2018-05-31 NOTE — Telephone Encounter (Signed)
Virtual Visit Pre-Appointment Phone Call  Steps For Call:  1. Confirm consent - "In the setting of the current Covid19 crisis, you are scheduled for a (phone or video) visit with your provider on (date) at (time).  Just as we do with many in-office visits, in order for you to participate in this visit, we must obtain consent.  If you'd like, I can send this to your mychart (if signed up) or email for you to review.  Otherwise, I can obtain your verbal consent now.  All virtual visits are billed to your insurance company just like a normal visit would be.  By agreeing to a virtual visit, we'd like you to understand that the technology does not allow for your provider to perform an examination, and thus may limit your provider's ability to fully assess your condition. If your provider identifies any concerns that need to be evaluated in person, we will make arrangements to do so.  Finally, though the technology is pretty good, we cannot assure that it will always work on either your or our end, and in the setting of a video visit, we may have to convert it to a phone-only visit.  In either situation, we cannot ensure that we have a secure connection.  Are you willing to proceed? YES  2. Confirm the BEST phone number to call the day of the visit by including in appointment notes  3. Give patient instructions for MyChart download to smartphone OR [email or web address] as below if video visit (depending on what platform provider is using)  4. Confirm that appointment type is correct in Epic appointment notes (VIDEO vs PHONE)  5. Advise patient to be prepared with their blood pressure, heart rate, weight, any heart rhythm information, their current medicines, and a piece of paper and pen handy for any instructions they may receive the day of their visit  6. Inform patient they will receive a phone call 15 minutes prior to their appointment time (may be from unknown caller ID) so they should be prepared to  answer    TELEPHONE CALL NOTE  Ashley Whitehead has been deemed a candidate for a follow-up tele-health visit to limit community exposure during the Covid-19 pandemic. I spoke with the patient via phone to ensure availability of phone/video source, confirm preferred email & phone number, and discuss instructions and expectations.  I reminded Ashley Whitehead to be prepared with any vital sign and/or heart rhythm information that could potentially be obtained via home monitoring, at the time of her visit. I reminded Ashley Whitehead to expect a phone call prior to her visit.  Ethelda ChickPamela Pate Ingalls, RN 05/31/2018 4:11 PM  IF USING DOXIMITY or [email or web address].ME - The patient will receive a link just prior to their visit by text.     FULL LENGTH CONSENT FOR TELE-HEALTH VISIT   I hereby voluntarily request, consent and authorize [facility] and its employed or contracted physicians, physician assistants, nurse practitioners or other licensed health care professionals (the Practitioner), to provide me with telemedicine health care services (the "Services") as deemed necessary by the treating Practitioner. I acknowledge and consent to receive the Services by the Practitioner via telemedicine. I understand that the telemedicine visit will involve communicating with the Practitioner through live audiovisual communication technology and the disclosure of certain medical information by electronic transmission. I acknowledge that I have been given the opportunity to request an in-person assessment or other available alternative prior to the telemedicine visit and am  voluntarily participating in the telemedicine visit.  I understand that I have the right to withhold or withdraw my consent to the use of telemedicine in the course of my care at any time, without affecting my right to future care or treatment, and that the Practitioner or I may terminate the telemedicine visit at any time. I understand that I have the right to  inspect all information obtained and/or recorded in the course of the telemedicine visit and may receive copies of available information for a reasonable fee.  I understand that some of the potential risks of receiving the Services via telemedicine include:  Marland Kitchen Delay or interruption in medical evaluation due to technological equipment failure or disruption; . Information transmitted may not be sufficient (e.g. poor resolution of images) to allow for appropriate medical decision making by the Practitioner; and/or  . In rare instances, security protocols could fail, causing a breach of personal health information.  Furthermore, I acknowledge that it is my responsibility to provide information about my medical history, conditions and care that is complete and accurate to the best of my ability. I acknowledge that Practitioner's advice, recommendations, and/or decision may be based on factors not within their control, such as incomplete or inaccurate data provided by me or distortions of diagnostic images or specimens that may result from electronic transmissions. I understand that the practice of medicine is not an exact science and that Practitioner makes no warranties or guarantees regarding treatment outcomes. I acknowledge that I will receive a copy of this consent concurrently upon execution via email to the email address I last provided but may also request a printed copy by calling the office of [facility].    I understand that my insurance will be billed for this visit.   I have read or had this consent read to me. . I understand the contents of this consent, which adequately explains the benefits and risks of the Services being provided via telemedicine.  . I have been provided ample opportunity to ask questions regarding this consent and the Services and have had my questions answered to my satisfaction. . I give my informed consent for the services to be provided through the use of  telemedicine in my medical care  By participating in this telemedicine visit I agree to the above.

## 2018-06-01 ENCOUNTER — Ambulatory Visit: Payer: Medicare Other | Admitting: Physician Assistant

## 2018-06-02 ENCOUNTER — Other Ambulatory Visit: Payer: Self-pay

## 2018-06-02 ENCOUNTER — Encounter: Payer: Self-pay | Admitting: Cardiovascular Disease

## 2018-06-02 ENCOUNTER — Telehealth (INDEPENDENT_AMBULATORY_CARE_PROVIDER_SITE_OTHER): Payer: Medicare Other | Admitting: Cardiovascular Disease

## 2018-06-02 VITALS — BP 122/76 | Ht 65.0 in | Wt 155.0 lb

## 2018-06-02 DIAGNOSIS — R42 Dizziness and giddiness: Secondary | ICD-10-CM

## 2018-06-02 DIAGNOSIS — R55 Syncope and collapse: Secondary | ICD-10-CM | POA: Diagnosis not present

## 2018-06-02 DIAGNOSIS — R011 Cardiac murmur, unspecified: Secondary | ICD-10-CM

## 2018-06-02 NOTE — Patient Instructions (Addendum)
Medication Instructions:   If you need a refill on your cardiac medications before your next appointment, please call your pharmacy.   Lab work:  If you have labs (blood work) drawn today and your tests are completely normal, you will receive your results only by: . MyChart Message (if you have MyChart) OR . A paper copy in the mail If you have any lab test that is abnormal or we need to change your treatment, we will call you to review the results.  Testing/Procedures: Your physician has requested that you have an echocardiogram. Echocardiography is a painless test that uses sound waves to create images of your heart. It provides your doctor with information about the size and shape of your heart and how well your heart's chambers and valves are working. This procedure takes approximately one hour. There are no restrictions for this procedure.  Follow-Up: At [facility], you and your health needs are our priority.  As part of our continuing mission to provide you with exceptional heart care, we have created designated Provider Care Teams.  These Care Teams include your primary Cardiologist (physician) and Advanced Practice Providers (APPs -  Physician Assistants and Nurse Practitioners) who all work together to provide you with the care you need, when you need it. You will need a follow up appointment as needed with Dr. Nishan.   

## 2018-08-10 ENCOUNTER — Telehealth (HOSPITAL_COMMUNITY): Payer: Self-pay

## 2018-08-10 ENCOUNTER — Telehealth: Payer: Self-pay | Admitting: Cardiovascular Disease

## 2018-08-10 NOTE — Telephone Encounter (Signed)
LMTCB COVID prescreening for echo. No DPR on file to leave echo appt details.

## 2018-08-10 NOTE — Telephone Encounter (Signed)
New Message     Patient returning your call for prescreening questions.

## 2018-08-11 ENCOUNTER — Ambulatory Visit (HOSPITAL_COMMUNITY): Payer: Medicare Other | Attending: Cardiovascular Disease

## 2018-08-11 ENCOUNTER — Other Ambulatory Visit: Payer: Self-pay

## 2018-08-11 DIAGNOSIS — R55 Syncope and collapse: Secondary | ICD-10-CM | POA: Diagnosis present

## 2018-08-12 ENCOUNTER — Telehealth: Payer: Self-pay | Admitting: Cardiovascular Disease

## 2018-08-12 NOTE — Telephone Encounter (Signed)
Patient received a message from our office yesterday about the results of her Echo done on 07/01. She is calling back returning our message

## 2018-08-12 NOTE — Telephone Encounter (Signed)
Pt has been notified of echo results by phone with verbal understanding. Pt thanked me for the good news.

## 2018-11-08 ENCOUNTER — Other Ambulatory Visit: Payer: Self-pay | Admitting: Family Medicine

## 2018-11-08 DIAGNOSIS — Z1231 Encounter for screening mammogram for malignant neoplasm of breast: Secondary | ICD-10-CM

## 2018-12-22 ENCOUNTER — Other Ambulatory Visit: Payer: Self-pay

## 2018-12-22 ENCOUNTER — Ambulatory Visit
Admission: RE | Admit: 2018-12-22 | Discharge: 2018-12-22 | Disposition: A | Discharge status: Home / Self Care | Payer: Medicare Other | Source: Ambulatory Visit | Attending: Family Medicine | Admitting: Family Medicine

## 2018-12-22 DIAGNOSIS — Z1231 Encounter for screening mammogram for malignant neoplasm of breast: Secondary | ICD-10-CM

## 2019-03-01 ENCOUNTER — Ambulatory Visit: Payer: Medicare PPO | Attending: Internal Medicine

## 2019-03-01 NOTE — Progress Notes (Signed)
   Covid-19 Vaccination Clinic  Name:  Ashley Whitehead    MRN: [id number] DOB: 1946-02-06  03/01/2019  [name] was observed post Covid-19 immunization for 15 minutes without incidence. She was provided with Vaccine Information Sheet and instruction to access the V-Safe system.   [name] was instructed to call 911 with any severe reactions post vaccine: Marland Kitchen Difficulty breathing  . Swelling of your face and throat  . A fast heartbeat  . A bad rash all over your body  . Dizziness and weakness    Immunizations Administered    Name Date Dose VIS Date Route   Pfizer COVID-19 Vaccine 03/01/2019 -- 01/2019 --   Manufacturer: [employer]   Lot: [id number]   NDC: [address]-2     Given by [facility].

## 2019-03-03 ENCOUNTER — Encounter: Payer: Self-pay | Admitting: Psychology

## 2019-03-03 ENCOUNTER — Other Ambulatory Visit: Payer: Self-pay

## 2019-03-03 ENCOUNTER — Encounter: Payer: Medicare PPO | Attending: Psychology | Admitting: Psychology

## 2019-03-03 DIAGNOSIS — F411 Generalized anxiety disorder: Secondary | ICD-10-CM | POA: Diagnosis present

## 2019-03-03 DIAGNOSIS — R269 Unspecified abnormalities of gait and mobility: Secondary | ICD-10-CM | POA: Insufficient documentation

## 2019-03-03 DIAGNOSIS — R413 Other amnesia: Secondary | ICD-10-CM | POA: Diagnosis present

## 2019-03-03 NOTE — Progress Notes (Signed)
Neuropsychological Consultation   Patient:   Ashley Whitehead   DOB:   09-03-1945  MR Number:  [id number]  Location:  [facility] FOR PAIN AND [facility] MEDICINE [facility] PHYSICAL MEDICINE AND REHABILITATION [address] [place], [address] [id number] [facility] [place] [address] Dept: [phone]           Date of Service:   03/03/2019  Start Time:   9 AM End Time:   11 AM  Today's visit was 2 hours in total.  1 hour was spent in an in person visit conducting clinical interview that was conducted in my outpatient clinic office with the patient and myself present.  The second hour was utilized with records review and report writing.  Provider/Observer:  Arley Phenix, Psy.D.       Clinical Neuropsychologist       Billing Code/Service: [id number]  Chief Complaint:    Ashley Whitehead is a 74 year old female referred by Stacy Gardner, MD with [facility] with [facility] health.  The patient was initially referred for neurological evaluation due to continued dizziness following a concussive event that happened in November 2019.  The patient is described to of fallen while going upstairs and hit her head with a very brief few second.  Of loss of consciousness.  The patient was seen in the emergency department with no findings of acute changes on head CT.  The patient did fracture her wrist in this accident and had surgery a week later with Dr. Amanda Pea.  The patient developed significant vertigo after this fall that affected her mobility.  The patient is reported to have not driven for 6 months after this accident.  The patient describes continued mild memory issues along with longstanding issues with anxiety and depression that are exacerbated by worry about developing dementia or some type of cognitive deficit.  The patient describes ongoing memory changes particularly with retrieval of information.  She denies any tremor, geographic disorientation or significant  changes in expressive language.  Reason for Service:  Ashley Whitehead is a 74 year old female referred by Stacy Gardner, MD with [facility] with [facility] health.  The patient was initially referred for neurological evaluation due to continued dizziness following a concussive event that happened in November 2019.  The patient is described to of fallen while going upstairs and hit her head with a very brief few second.  Of loss of consciousness.  The patient was seen in the emergency department with no findings of acute changes on head CT.  The patient did fracture her wrist in this accident and had surgery a week later with Dr. Amanda Pea.  The patient developed significant vertigo after this fall that affected her mobility.  The patient is reported to have not driven for 6 months after this accident.  The patient describes continued mild memory issues along with longstanding issues with anxiety and depression that are exacerbated by worry about developing dementia or some type of cognitive deficit.  The patient describes ongoing memory changes particularly with retrieval of information.  She denies any tremor, geographic disorientation or significant changes in expressive language.  The patient reports that her dizziness that developed after the fall have improved significantly but she has continued with some residual balance issues and gait changes.  She has been referred for PT for the gait changes which she felt have been helpful for her.  The patient reports that she was having many of these issues to  some degree even before the fall including some of her memory issues.  The patient reports that her anxiety exacerbates her memory difficulties.  She also describes significant chronic sleep deprivation and poor sleep.  She has been assessed by her neurologist for issues of sleep apnea.  The patient reports that after the accident in 2019 that she did not drive for 6 months.  She reports  that she did this because of her dizziness and difficulties maintaining her train of thought.  Memory issues related to word finding issues particularly people's names are names of places continue in that she will lose her train of thought.  The patient denies any family history of any degenerative dementia type processes.  She reports that her mother did develop some cognitive issues in the last weeks of her life but passed away at 57.  No other family history of dementia noted.  The patient has dealt with issues of anxiety and panic disorder in the past and has had therapy for her anxiety and depression.  Medications have included alprazolam and Paxil.  The patient has prolonged and chronic sleep difficulties.  The patient reports that she sometimes does not fall asleep until around 4 AM.  She reports that she has a hard time getting out of bed in the morning because of sleep issues.  She reports that she tries to go to bed around 10 PM but does not fall asleep for several hours later.  The patient reports that she gets up in the morning around 10 AM or later.  She reports that she does not feel rested.  Appetite is described as good but denies having healthy eating habits in particular.  She also describes some mild changes in taste but no changes in her sense of smell.  The patient reports that her husband does not identify the degree of memory issues that the patient noticed.  The patient reports that her daughter does not see problems with her memory but feels that the patient has become more outspoken and less positive in [place] and sharing her feelings with others.  The patient had an MRI conducted on 09/14/2018 that indicated white matter changes and pontine gliosis consistent with small vessel ischemic disease.  There were no acute abnormalities noted.  Reliability of Information: The information is derived from 1 hour face-to-face clinical interview with the patient as well as review of available  medical records.  Behavioral Observation: Ashley Whitehead  presents as a 74 y.o.-year-old Right Caucasian Female who appeared her stated age. her dress was Appropriate and she was Well Groomed and her manners were Appropriate to the situation.  her participation was indicative of Appropriate and Attentive behaviors.  There were not any physical disabilities noted.  she displayed an appropriate level of cooperation and motivation.     Interactions:    Active Appropriate and Attentive  Attention:   within normal limits and attention span and concentration were age appropriate  Memory:   within normal limits; recent and remote memory intact  Visuo-spatial:  not examined  Speech (Volume):  normal  Speech:   normal; normal  Thought Process:  Coherent and Relevant  Though Content:  WNL; not suicidal and not homicidal  Orientation:   person, place, time/date and situation  Judgment:   Good  Planning:   Good  Affect:    Anxious  Mood:    Anxious  Insight:   Good  Intelligence:   high  Marital Status/Living: The patient was born  and raised in [facility] [place].  She grew up with 2 siblings.  She was born as part of a set of twins.  She weighed 5 pounds plus at birth.  Developmental milestones were reached at the appropriate time.  The only major childhood illnesses of note were measles, mumps and tonsillitis.  The patient is married and has been married for 19 years to her current husband.  She was married for 22 years previously.  The patient has 2 biological children age 51 and 65 with some issues related to anxiety and substance abuse in her children.  The patient's son has completely stopped with his substance abuse.  Current Employment: The patient is currently retired.  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  Hobbies and interests  have included decorating, gardening, puppies, Bible study, shopping, water coloring, spending time at the beach and or mountains and taking care of grandchildren.  Substance Use:  No concerns of substance abuse are reported.    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  She graduated from [employer].  Her best subject in school was [place] literature and had some difficulties with statistics but not significantly.  Medical History:   Past Medical History:  Diagnosis Date  . Anxiety   . Depression   . GERD (gastroesophageal reflux disease)   . Heart murmur    innocent  . Hypercholesterolemia   . Hypothyroidism   . Osteoporosis   . Renal infection   . Wrist fracture     Psychiatric History:  The patient does have a history of depression anxiety with anxiety being a prominent feature.  She has also had panic attacks at time and has had care for her anxiety, depression and panic disorder.  Family Med/Psych History:  Family History  Problem Relation Age of Onset  . Cancer Mother     Risk of Suicide/Violence: virtually non-existent patient denies any suicidal or homicidal ideation.  Impression/DX:  Ashley Whitehead is a 74 year old female referred by Stacy Gardner, MD with [facility] with [facility] health.  The patient was initially referred for neurological evaluation due to continued dizziness following a concussive event that happened in November 2019.  The patient is described to of fallen while going upstairs and hit her head with a very brief few second.  Of loss of consciousness.  The patient was seen in the emergency department with no findings of acute changes on head CT.  The patient did fracture her wrist in this accident and had surgery a week later with Dr. Amanda Pea.  The patient developed significant vertigo after this fall that affected her mobility.  The patient is reported to  have not driven for 6 months after this accident.  The patient describes continued mild memory issues along with longstanding issues with anxiety and depression that are exacerbated by worry about developing dementia or some type of cognitive deficit.  The patient describes ongoing memory changes particularly with retrieval of information.  She denies any tremor, geographic disorientation or significant changes in expressive language.  Disposition/Plan:  The patient has been set up for formal neuropsychological testing.  We we will administer the Wechsler Adult Intelligence Scale-IV as well as the Wechsler Memory Scale's.  A determination will be made after these are administered  as to any other test that might need to be completed for answering diagnostic and referral questions.  Diagnosis:    Memory loss of unknown cause  Generalized anxiety disorder  Gait disorder         Electronically Signed   _______________________ Ilean Skill, Psy.D.

## 2019-03-14 ENCOUNTER — Encounter: Payer: Medicare PPO | Admitting: Psychology

## 2019-03-22 ENCOUNTER — Ambulatory Visit: Payer: Medicare PPO | Attending: Internal Medicine

## 2019-03-22 DIAGNOSIS — Z23 Encounter for immunization: Secondary | ICD-10-CM | POA: Insufficient documentation

## 2019-03-22 NOTE — Progress Notes (Signed)
   Covid-19 Vaccination Clinic  Name:  Ashley Whitehead    MRN: [id number] DOB: Mar 31, 1945  03/22/2019  Ashley Whitehead was observed post Covid-19 immunization for 15 minutes without incidence. She was provided with Vaccine Information Sheet and instruction to access the V-Safe system.   Ashley Whitehead was instructed to call 911 with any severe reactions post vaccine: Marland Kitchen Difficulty breathing  . Swelling of your face and throat  . A fast heartbeat  . A bad rash all over your body  . Dizziness and weakness    Immunizations Administered    Name Date Dose VIS Date Route   Pfizer COVID-19 Vaccine 03/22/2019  8:25 AM 0.3 mL 01/21/2019 Intramuscular   Manufacturer: [employer], [employer]   Lot: [id number]   NDC: [address]-9

## 2019-03-23 ENCOUNTER — Ambulatory Visit: Payer: Medicare Other

## 2019-03-25 ENCOUNTER — Encounter: Payer: Medicare PPO | Admitting: Psychology

## 2019-03-28 ENCOUNTER — Other Ambulatory Visit: Payer: Self-pay

## 2019-03-28 ENCOUNTER — Encounter: Payer: Medicare PPO | Attending: Psychology | Admitting: Psychology

## 2019-03-28 ENCOUNTER — Encounter: Payer: Self-pay | Admitting: Psychology

## 2019-03-28 DIAGNOSIS — R269 Unspecified abnormalities of gait and mobility: Secondary | ICD-10-CM | POA: Diagnosis present

## 2019-03-28 DIAGNOSIS — R413 Other amnesia: Secondary | ICD-10-CM | POA: Insufficient documentation

## 2019-03-28 DIAGNOSIS — F411 Generalized anxiety disorder: Secondary | ICD-10-CM | POA: Diagnosis present

## 2019-03-28 DIAGNOSIS — F329 Major depressive disorder, single episode, unspecified: Secondary | ICD-10-CM | POA: Diagnosis present

## 2019-03-28 DIAGNOSIS — F419 Anxiety disorder, unspecified: Secondary | ICD-10-CM | POA: Diagnosis present

## 2019-03-28 NOTE — Progress Notes (Addendum)
The patient arrived on time to her 1:00 testing appointment, which lasted 90 minutes.  Behavioral Observations:  Appearance: Casually and appropriately dressed with good hygiene. Gait: Ambulated independently without assistance. Speech: Clear, normal rate, normal tone & volume. Thought process:  Somewhat disorganized, concrete (nonverbal reasoning<Verbal reasoning), & impulsive.  Mood/Affect: Mildly anxious. Appropriate.  Interpersonal: Polite and appropriate. Orientation: Oriented x 4 Effort/Motivation: Excellent    She did not appear to have difficulty seeing, hearing, or understanding test items/questions and did not require much additional prompting. She exhibited good distress tolerance on questions he did not know or tasks that were more difficult. She was cooperative with all assigned tasks. Patient became fatigued after completing the Wechsler Adult Intelligence Scale (WAIS-IV) and requested that she complete testing another day. Patient is now scheduled to complete Wechsler Memory Scale, 4th Edition, WMS-IV (Older Adult Battery) on 04/01/2019 at 10:00.    Tests Administered: . Wechsler Adult Intelligence Scale, 4th Edition, (WAIS-IV) Results:   WAIS-IV Composite Score Summary  Scale Sum of Scaled Scores Composite Score Percentile Rank 95% Conf. Interval Qualitative Description  Verbal Comprehension 30 VCI 100 50 94-106 Average  Perceptual Reasoning 17 PRI 75 5 70-82 Borderline  Working Memory 15 WMI 86 18 80-94 Low Average  Processing Speed 20 PSI 100 50 92-108 Average  Full Scale 82 FSIQ 87 19 83-91 Low Average  General Ability 47 GAI 86 18 81-91 Low Average   Index Level Discrepancy Comparisons  Comparison Score 1 Score 2 Difference Critical Value .05 Significant Difference Y/N Base Rate by Overall Sample  VCI - PRI 100 75 25 8.31 Y 3.6  VCI - WMI 100 86 14 9.30 Y 14.1  VCI - PSI 100 100 0 10.19 N   PRI - WMI 75 86 -11 10.18 Y 20.3  PRI - PSI 75 100 -25 11.00 Y 4.7   WMI - PSI 86 100 -14 11.76 Y 17.7  FSIQ - GAI 87 86 1 3.61 N 46.3   Differences Between Subtest and Overall Mean of Subtest Scores  Subtest Subtest Scaled Score Mean Scaled Score Difference Critical Value .05 Strength or Weakness Base Rate  Block Design 6 8.20 -2.20 2.85  >25%  Similarities 11 8.20 2.80 2.82  15%  Digit Span 10 8.20 1.80 2.22  >25%  Matrix Reasoning 5 8.20 -3.20 2.54 W 10-15%  Vocabulary 11 8.20 2.80 2.03 S 10-15%  Arithmetic 5 8.20 -3.20 2.73 W 10%  Symbol Search 10 8.20 1.80 3.42  >25%  Visual Puzzles 6 8.20 -2.20 2.71  >25%  Information 8 8.20 -0.20 2.19  >25%  Coding 10 8.20 1.80 2.97  >25%

## 2019-04-01 ENCOUNTER — Other Ambulatory Visit: Payer: Self-pay

## 2019-04-01 ENCOUNTER — Encounter: Payer: Self-pay | Admitting: Psychology

## 2019-04-01 ENCOUNTER — Encounter: Payer: Medicare PPO | Admitting: Psychology

## 2019-04-01 DIAGNOSIS — R413 Other amnesia: Secondary | ICD-10-CM

## 2019-04-01 NOTE — Progress Notes (Addendum)
The patient arrived on time to her 10:00 testing appointment, which lasted 120 minutes.  Behavioral Observations:  Appearance: Casually and appropriately dressed with good hygiene. Gait: Ambulated independently without assistance. Speech: Clear, normal rate, normal tone & volume. Thought process:  Somewhat disorganized but mostly logical  Mood/Affect: Mildly anxious. Appropriate.  Interpersonal: Polite and appropriate. Orientation: Oriented x 4 Effort/Motivation: Excellent    She did not appear to have difficulty seeing, hearing, or understanding test items/questions and did not require much additional prompting. She exhibited good distress tolerance on questions he did not know or tasks that were more difficult. She was cooperative with all assigned tasks.   Tests Administered: . Wechsler Memory Scale, 4th Edition, WMS-IV (Older Adult Battery)  Results (03/28/2019 & 04/01/2019)  WAIS-IV Composite Score Summary  Scale Sum of Scaled Scores Composite Score Percentile Rank 95% Conf. Interval Qualitative Description  Verbal Comprehension 30 VCI 100 50 94-106 Average  Perceptual Reasoning 17 PRI 75 5 70-82 Borderline  Working Memory 15 WMI 86 18 80-94 Low Average  Processing Speed 20 PSI 100 50 92-108 Average  Full Scale 82 FSIQ 87 19 83-91 Low Average  General Ability 47 GAI 86 18 81-91 Low Average   Index Level Discrepancy Comparisons  Comparison Score 1 Score 2 Difference Critical Value .05 Significant Difference Y/N Base Rate by Overall Sample  VCI - PRI 100 75 25 8.31 Y 3.6  VCI - WMI 100 86 14 9.30 Y 14.1  VCI - PSI 100 100 0 10.19 N   PRI - WMI 75 86 -11 10.18 Y 20.3  PRI - PSI 75 100 -25 11.00 Y 4.7  WMI - PSI 86 100 -14 11.76 Y 17.7  FSIQ - GAI 87 86 1 3.61 N 46.3   Differences Between Subtest and Overall Mean of Subtest Scores  Subtest Subtest Scaled Score Mean Scaled Score Difference Critical Value .05 Strength or Weakness Base Rate  Block Design 6 8.20 -2.20  2.85  >25%  Similarities 11 8.20 2.80 2.82  15%  Digit Span 10 8.20 1.80 2.22  >25%  Matrix Reasoning 5 8.20 -3.20 2.54 W 10-15%  Vocabulary 11 8.20 2.80 2.03 S 10-15%  Arithmetic 5 8.20 -3.20 2.73 W 10%  Symbol Search 10 8.20 1.80 3.42  >25%  Visual Puzzles 6 8.20 -2.20 2.71  >25%  Information 8 8.20 -0.20 2.19  >25%  Coding 10 8.20 1.80 2.97  >25%   WMS-IV (Older Adult)  Brief Cognitive Status Exam Classification  Age Years of Education Raw Score Classification Level Base Rate  73 years 10 months 16 51 Low Average 26.3    Index Score Summary  Index Sum of Scaled Scores Index Score Percentile Rank 95% Confidence Interval Qualitative Descriptor  Auditory Memory (AMI) 50 115 84 108-120 High Average  Visual Memory (VMI) 18 95 37 90-100 Average  Immediate Memory (IMI) 34 108 70 101-114 Average  Delayed Memory (DMI) 34 108 70 100-115 Average   Primary Subtest Scaled Score Summary  Subtest Domain Raw Score Scaled Score Percentile Rank  Logical Memory I AM 36 12 75  Logical Memory II AM 24 12 75  Verbal Paired Associates I AM 28 13 84  Verbal Paired Associates II AM 8 13 84  Visual Reproduction I VM 27 9 37  Visual Reproduction II VM 16 9 37  Symbol Span VWM 14 9 37   PROCESS SCORE CONVERSIONS  Auditory Memory Process Score Summary  Process Score Raw Score Scaled Score Percentile Rank Cumulative Percentage [facility]  Rate)  LM II Recognition 18 - - 26-50%  VPA II Recognition 30 - - >75%   Visual Memory Process Score Summary  Process Score Raw Score Scaled Score Percentile Rank Cumulative Percentage (Base Rate)  VR II Recognition 2 - - 10-16%    ABILITY-MEMORY ANALYSIS  Ability Score:  VCI: 100 Date of Testing:  WAIS-IV; WMS-IV 2019/03/28  Predicted Difference Method   Index Predicted WMS-IV Index Score Actual WMS-IV Index Score Difference Critical Value  Significant Difference Y/N Base Rate  Auditory Memory 100 115 -15 10.41 Y 10-15%  Visual Memory 100 95 5 7.35  N   Immediate Memory 100 108 -8 9.69 N   Delayed Memory 100 108 -8 12.22 N   Statistical significance (critical value) at the .01 level.   Contrast Scaled Scores  Score Score 1 Score 2 Contrast Scaled Score  General Ability Index vs. Auditory Memory Index 86 115 15  General Ability Index vs. Visual Memory Index 86 95 11  General Ability Index vs. Immediate Memory Index 86 108 14  General Ability Index vs. Delayed Memory Index 86 108 13  Verbal Comprehension Index vs. Auditory Memory Index 100 115 14  Perceptual Reasoning Index vs. Visual Memory Index 75 95 12  Working Memory Index vs. Auditory Memory Index 86 115 15

## 2019-04-04 ENCOUNTER — Encounter (HOSPITAL_BASED_OUTPATIENT_CLINIC_OR_DEPARTMENT_OTHER): Payer: Medicare PPO | Admitting: Psychology

## 2019-04-04 ENCOUNTER — Encounter: Payer: Self-pay | Admitting: Psychology

## 2019-04-04 ENCOUNTER — Other Ambulatory Visit: Payer: Self-pay

## 2019-04-04 DIAGNOSIS — G4701 Insomnia due to medical condition: Secondary | ICD-10-CM | POA: Diagnosis not present

## 2019-04-04 DIAGNOSIS — F411 Generalized anxiety disorder: Secondary | ICD-10-CM

## 2019-04-04 DIAGNOSIS — R413 Other amnesia: Secondary | ICD-10-CM | POA: Diagnosis not present

## 2019-04-04 DIAGNOSIS — Z712 Person consulting for explanation of examination or test findings: Secondary | ICD-10-CM

## 2019-04-04 NOTE — Progress Notes (Addendum)
Patient:  Ashley Whitehead   DOB: 10-06-1945  MR Number: [id number]  Location: [facility] PHYSICAL MEDICINE AND REHABILITATION [address] [place], [address] [id number] [facility] [place] [address] Dept: [phone]  Start: 1 PM End: 2 PM  Provider/Observer:     Hershal Coria PsyD  Chief Complaint:      Chief Complaint  Patient presents with  . Memory Loss  . Dizziness  . Anxiety  . Depression    Reason For Service:      Ashley Whitehead is a 74 year old female referred by Ashley Gardner, MD with [facility] with [facility].  The patient was initially referred for neurological evaluation due to continued dizziness following a concussive event that happened in November 2019.  The patient is described to of fallen while going upstairs and hit her head with a very brief few second loss of consciousness.  The patient was seen in the emergency department with no findings of acute changes on head CT.  The patient did fracture her wrist in this accident and had surgery a week later with Dr. Amanda Pea.  The patient developed significant vertigo after this fall that affected her mobility.  The patient is reported to have not driven for 6 months after this accident.  The patient describes continued mild memory issues along with longstanding issues with anxiety and depression that are exacerbated by worry about developing dementia or some type of cognitive deficit.  The patient describes ongoing memory changes particularly with retrieval of information.  She denies any tremor, geographic disorientation or significant changes in expressive language.  The patient reports that her dizziness that developed after the fall has improved significantly but she has continued with some residual balance issues and gait changes.  She has been referred for PT for the gait changes which she felt have been helpful for her.  The patient  reports that she was having many of these issues to some degree even before the fall including some of her memory issues.  The patient reports that her anxiety exacerbates her memory difficulties.  She also describes significant chronic sleep deprivation and poor sleep.  She has been assessed by her neurologist for issues of sleep apnea.  The patient reports that after the accident in 2019 that she did not drive for 6 months.  She reports that she did this because of her dizziness and difficulties maintaining her train of thought.  Memory issues related to word finding issues, particularly people's names are names of places continue and that she will lose her train of thought.  The patient denies any family history of any degenerative dementia type processes.  She reports that her mother did develop some cognitive issues in the last weeks of her life but passed away at 46.  No other family history of dementia noted.  The patient has dealt with issues of anxiety and panic disorder in the past and has had therapy for her anxiety and depression.  Medications have included alprazolam and Paxil.  The patient has prolonged and chronic sleep difficulties.  The patient reports that she sometimes does not fall asleep until around 4 AM.  She reports that she has a hard time getting out of bed in the morning because of sleep issues.  She reports that she tries to go to bed around 10 PM but does not fall asleep for several hours later.  The patient reports that she gets  up in the morning around 10 AM or later.  She reports that she does not feel rested.  Appetite is described as good but denies having healthy eating habits in particular.  She also describes some mild changes in taste but no changes in her sense of smell.  The patient reports that her husband does not identify the degree of memory issues that the patient noticed.  The patient reports that her daughter does not see problems with her memory but feels that  the patient has become more outspoken and less positive and bolder and sharing her feelings with others.  The patient had an MRI conducted on 09/14/2018 that indicated white matter changes and pontine gliosis consistent with small vessel ischemic disease.  There were no acute abnormalities noted.  Behavioral Observations: Appearance:Casually and appropriately dressed with good hygiene. Gait:Ambulated independently without assistance. Speech:Clear, normal rate, normal tone & volume. Thought process: Somewhat disorganized, concrete (nonverbal reasoning<Verbal reasoning), & impulsive.  Mood/Affect:Mildly anxious. Appropriate.  Interpersonal: Polite and appropriate. Orientation: Oriented x 4 Effort/Motivation: Excellent    She did not appear to have difficulty seeing, hearing, or understanding test items/questions and did not require much additional prompting. She exhibited good distress tolerance on questions he did not know or tasks that were more difficult. She was cooperative with all assigned tasks. Patient became fatigued after completing the Wechsler Adult Intelligence Scale (WAIS-IV) and requested that she complete testing another day. Patient completed Wechsler Memory Scale, 4th Edition, WMS-IV (Older Adult Battery) on 04/01/2019.  The patient also has untreated cataract that she plans on having treated soon.  This may play a role in some visual oriented measures.   Tests Administered:  Wechsler Adult Intelligence Scale, 4th Edition, (WAIS-IV)  Wechsler Memory Scale, 4th Edition, WMS-IV (Older Adult Battery)  Test Results:   Initially, an estimation was made as to the patient's historical/premorbid intellectual and cognitive functioning to use as a predicted baseline for current functioning levels.  Based on the patient's educational history, occupational history and psychosocial features it is estimated that she likely performed on global composite score somewhere between 110 and 120  T-scores respectively with likely greater functioning on verbal base skills given her educational history.  This would rank her on various domains between the upper end of the average range to high average range for intellectual and cognitive functioning conservatively.   Brief Cognitive Status Exam Classification   Age Years of Education Raw Score Classification Level Base Rate  73 years 10 months 16 51 Low Average 26.3   The patient was administered the brief cognitive status exam from the Wechsler memory scale in which she performed in low average range with some difficulties around orientation.  WAIS-IV          Composite Score Summary   Scale Sum of Scaled Scores Composite Score Percentile Rank 95% Conf. Interval Qualitative Description  Verbal Comprehension 30 VCI 100 50 94-106 Average  Perceptual Reasoning 17 PRI 75 5 70-82 Borderline  Working Memory 15 WMI 86 18 80-94 Low Average  Processing Speed 20 PSI 100 50 92-108 Average  Full Scale 82 FSIQ 87 19 83-91 Low Average  General Ability 47 GAI 86 18 81-91 Low Average   The patient was administered the Wechsler Adult Intelligence Scale-IV.  She produced a full-scale IQ score of 87 which falls at the 19th percentile and is in the low average range.  We also calculated the patient's general abilities index which places less emphasis on working memory and processing speed  variables which are more acutely affected and subject to change.  The patient's general abilities index score was an 19 which places her at the 18th percentile and in the low average range.  Both of these global indices are significantly below historical/predicted levels of functioning overall and do indicate that there are 1 or more particular areas of cognitive changes present.   Differences Between Subtest and Overall Mean of Subtest Scores   Subtest Subtest Scaled Score Mean Scaled Score Difference Critical Value .05 Strength or Weakness Base Rate  Block Design  6 8.20 -2.20 2.85  >25%  Similarities 11 8.20 2.80 2.82  15%  Digit Span 10 8.20 1.80 2.22  >25%  Matrix Reasoning 5 8.20 -3.20 2.54 W 10-15%  Vocabulary 11 8.20 2.80 2.03 S 10-15%  Arithmetic 5 8.20 -3.20 2.73 W 10%  Symbol Search 10 8.20 1.80 3.42  >25%  Visual Puzzles 6 8.20 -2.20 2.71  >25%  Information 8 8.20 -0.20 2.19  >25%  Coding 10 8.20 1.80 2.97  >25%    Verbal base skills:  The patient produced a verbal comprehension index score of 100 which falls at the 50th percentile and is in the average range relative to a normative population.  This is somewhat below predicted levels of premorbid functioning.  Individual subtest making up this measure show that the patient's vocabulary skills are one of her particular strengths and the patient also does well on verbal reasoning and problem-solving skills.  The patient's general fund of information is below what would be expected based on her history and suggest some difficulties with retrieving of previously learned information.  Visual-spatial base skills:  The patient produced a perceptual reasoning index score of 75 which falls at the 5th percentile and is in the borderline range.  We have to take into account the patient's description of some issues with her vision due to cataracts as these may have played a role in visual spatial components.  The patient showed significant weakness and deficits on subtests assessing her visual analysis and organizational abilities, visual reasoning and problem-solving abilities, and visual estimation and judgment components.  All of these are significantly impaired relative to both normative expectations as well as historical predictions.  Working [employer] fast streamer and encoding abilities:   The patient produced a working memory index score of 86 which falls at the 18th percentile and is in the low average range.  This is significantly below predicted levels of functioning and below normative expectations.   Individual subtest making up these measures show that the patient's pure auditory encoding abilities falls in the average range but she had significant difficulties with actively processing that information during the encoding phase.  Information processing speed/focus execute abilities:  The patient produced a processing speed index of 100 which falls at the 50th percentile and is in the average range.  This performance is generally consistent with predicted levels although slightly below expectations.  These components do have a rather significant visual processing component and it does not appear that the patient's visual changes significantly impacted these measures.  There was little variability in subtest performance on this composite score with all subtest falling in the average range and consistent with 1 another.  Memory and learning abilities:          Index Score Summary   Index Sum of Scaled Scores Index Score Percentile Rank 95% Confidence Interval Qualitative Descriptor  Auditory Memory (AMI) 50 115 84 108-120 High Average  Visual Memory (VMI) 18 95  37 90-100 Average  Immediate Memory (IMI) 34 108 70 101-114 Average  Delayed Memory (DMI) 34 108 70 100-115 Average   The patient was administered the Wechsler Memory Scale-for for older adults.  All measures assessed on the Wechsler adult intelligence scale, the patient's pure auditory encoding and the visual encoding measure on the Wechsler Memory Scale were within normal limits.  The patient's information processing speed were also within normal limits without indication of any significant deficits.  Breaking the patient's memory functions down between auditory and visual memory, the patient produced an auditory memory index score of 115 which falls at the 84th percentile and is in the high average range.  His auditory memory function was well within normal limits and consistent with predictions based on historical/premorbid  functioning.  The patient produced a visual memory index score of 95 which falls in the 37th percentile and is in the average range.  This is slightly below predicted levels of historical functioning but is not indicative of significant deficits.  However, there was a significant difference between auditory versus visual memory and learning with the patient's auditory memory functioning significantly better than her visual memory and learning.  Breaking the patient's memory and learning down between immediate and delayed suggest that both are generally doing well with no indication of any significant immediate or delayed memory functions.  The patient's immediate memory index score is 108 which falls at the [facility]70th percentile and in the average range.  Delayed memory index is 108 which falls at the [facility]70th percentile and in the average range.  Primary Subtest Scaled Score Summary   Subtest Domain Raw Score Scaled Score Percentile Rank  Logical Memory I AM 36 12 75  Logical Memory II AM 24 12 75  Verbal [employer] I AM 28 13 84  Verbal [employer] II AM 8 13 84  Visual Reproduction I VM 27 9 37  Visual Reproduction II VM 16 9 37  Symbol Span VWM 14 9 37    Auditory Memory Process Score Summary   Process Score Raw Score Scaled Score Percentile Rank Cumulative Percentage (Base Rate)  LM II Recognition 18 - - 26-50%  VPA II Recognition 30 - - >75%   The patient did fairly well on recognition measures for auditory learned information suggesting that the information is adequately encoded, stored and organized and available for review retrieval later.  There were no indications of any delayed memory free recall or cued (recognition) memory deficits.         Visual Memory Process Score Summary   Process Score Raw Score Scaled Score Percentile Rank Cumulative Percentage (Base Rate)  VR II Recognition 2 - - 10-16%   The patient again showed a similar pattern with better auditory versus  visual memory learning.  The patient had difficulty with visual recognition task and while this performance may be in affected by her difficulty with cataracts there is a consistent pattern of greater deficits visually versus auditorily.  ABILITY-MEMORY ANALYSIS  Ability Score:    VCI: 100 Date of Testing:           WAIS-IV; WMS-IV 2019/03/28           Predicted Difference Method   Index Predicted WMS-IV Index Score Actual WMS-IV Index Score Difference Critical Value  Significant Difference Y/N Base Rate  Auditory Memory 100 115 -15 10.41 Y 10-15%  Visual Memory 100 95 5 7.35 N   Immediate Memory 100 108 -8 9.69 N   Delayed  Memory 100 108 -8 12.22 N   Statistical significance (critical value) at the .01 level.    We also calculated the patient's ability-memory analysis scores.  Utilizing the patient's verbal comprehension index from the Wechsler Adult Intelligence Scale a predicted level of performance was made on various memory components.  This predicted level of performance is then compared against her actual achieve scores.  The patient did significantly better for her actual achieved score versus predicted score for her auditory memory learning.  Visual memory, immediate memory and delayed memory were all consistent with predicted levels with immediate and delayed memory somewhat greater and visual memory somewhat lower than predicted levels.   Summary of Results:   Overall, the results of the current neuropsychological evaluation are consistent with some very focal deficits with regard to visual spatial and visual reasoning/problem solving components as well as issues with more complex attention and information processing skills.  The patient showed good/retained verbal comprehension abilities, information processing speed abilities and in particular auditory learning and memory components.  While the patient showed relative weaknesses with regard to visual learning and memory  these were not significantly impaired but are below predictions based on her education occupational history.  There is a consistent pattern of difficulties with regard to visual spatial issues and visual memory.  However, the patient has been having difficulties with cataracts and is scheduled to have cataract surgery soon.  Impression/Diagnosis:   Overall, there does appear to be some consistency with the patient's descriptions of problems with attention and concentration particularly multi processing issues.  However, her verbal memory and learning was actually quite good and consistent with predicted levels.  The patient showed good language-based skills with the exception of her general fund of information, good verbal reasoning and problem-solving and maintained attention/encoding and information processing speed.  There did not appear to be any clear issues related to postconcussion syndrome and it is unclear how much the patient's visual spatial and visual memory aspects are being affected by her cataract/primary vision deficits.  I do think that the patient's prolonged significant sleep disturbance that has been exacerbated by her vertigo and balance issues following her fall along with an exacerbation of her pre-existing anxiety/panic attacks/depressive symptomatology are likely playing the biggest role in her current subjective reports of memory and cognitive difficulties.  The patient's MRI also suggest some degree of small vessel disease/microvascular ischemic changes but there does not appear to be enough microvascular changes to explain all of her difficulties.  This pattern of neuropsychological strengths and weaknesses is not consistent with those typically seen with various progressive cortical dementias or subcortical dementias outside of some microvascular and age-related changes.  I do think that it will be very important to pay particular attention to ongoing sleep disturbance as it is  likely playing a significant role in her day-to-day subjective memory weaknesses and issues.  This prolonged sleep disturbance in conjunction with anxiety and depressive symptoms are likely interacting to account for most of the patient's day-to-day cognitive changes and complaints.  Also, while the patient does show a pattern consistent with visual-spatial issues it is hard to place a clear emphasis on these symptoms.  The patient reports that she is having difficulties with her primary vision due to cataracts.  If the patient does not experience any improvement with regard to visual spatial functioning after her cataract surgery it may be warranted to do repeat testing in approximately 6 to 9 months around these issues.  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 Neuropsychologist

## 2019-04-12 ENCOUNTER — Ambulatory Visit: Payer: Medicare PPO | Admitting: Psychology

## 2019-04-12 ENCOUNTER — Encounter: Payer: Medicare PPO | Attending: Psychology | Admitting: Psychology

## 2019-04-12 ENCOUNTER — Other Ambulatory Visit: Payer: Self-pay

## 2019-04-12 ENCOUNTER — Encounter: Payer: Self-pay | Admitting: Psychology

## 2019-04-12 ENCOUNTER — Ambulatory Visit: Payer: Medicare Other | Admitting: Psychology

## 2019-04-12 DIAGNOSIS — F329 Major depressive disorder, single episode, unspecified: Secondary | ICD-10-CM | POA: Diagnosis present

## 2019-04-12 DIAGNOSIS — G4700 Insomnia, unspecified: Secondary | ICD-10-CM | POA: Insufficient documentation

## 2019-04-12 DIAGNOSIS — F411 Generalized anxiety disorder: Secondary | ICD-10-CM | POA: Diagnosis present

## 2019-04-12 DIAGNOSIS — R413 Other amnesia: Secondary | ICD-10-CM | POA: Insufficient documentation

## 2019-04-12 DIAGNOSIS — R269 Unspecified abnormalities of gait and mobility: Secondary | ICD-10-CM | POA: Diagnosis present

## 2019-04-12 DIAGNOSIS — G4701 Insomnia due to medical condition: Secondary | ICD-10-CM

## 2019-04-12 DIAGNOSIS — F419 Anxiety disorder, unspecified: Secondary | ICD-10-CM | POA: Diagnosis present

## 2019-04-12 NOTE — Progress Notes (Signed)
Today I provided feedback regarding the results of the recent neuropsychological evaluation.  Below you will find the summary and impressions/diagnostic information from that evaluation.  The complete report can be found in her EMR dated 04/04/2019.  We also reviewed issues related to her sleep disturbance and she will be setting up an appointment with Dr. Vella Kohler for cognitive behavioral therapies.    Summary of Results:                        Overall, the results of the current neuropsychological evaluation are consistent with some very focal deficits with regard to visual spatial and visual reasoning/problem solving components as well as issues with more complex attention and information processing skills.  The patient showed good/retained verbal comprehension abilities, information processing speed abilities and in particular auditory learning and memory components.  While the patient showed relative weaknesses with regard to visual learning and memory these were not significantly impaired but are below predictions based on her education occupational history.  There is a consistent pattern of difficulties with regard to visual spatial issues and visual memory.  However, the patient has been having difficulties with cataracts and is scheduled to have cataract surgery soon.  Impression/Diagnosis:                     Overall, there does appear to be some consistency with the patient's descriptions of problems with attention and concentration particularly multi processing issues.  However, her verbal memory and learning was actually quite good and consistent with predicted levels.  The patient showed good language-based skills with the exception of her general fund of information, good verbal reasoning and problem-solving and maintained attention/encoding and information processing speed.  There did not appear to be any clear issues related to postconcussion syndrome and it is unclear how much the patient's  visual spatial and visual memory aspects are being affected by her cataract/primary vision deficits.  I do think that the patient's prolonged significant sleep disturbance that has been exacerbated by her vertigo and balance issues following her fall along with an exacerbation of her pre-existing anxiety/panic attacks/depressive symptomatology are likely playing the biggest role in her current subjective reports of memory and cognitive difficulties.  The patient's MRI also suggest some degree of small vessel disease/microvascular ischemic changes but there does not appear to be enough microvascular changes to explain all of her difficulties.  This pattern of neuropsychological strengths and weaknesses is not consistent with those typically seen with various progressive cortical dementias or subcortical dementias outside of some microvascular and age-related changes.  I do think that it will be very important to pay particular attention to ongoing sleep disturbance as it is likely playing a significant role in her day-to-day subjective memory weaknesses and issues.  This prolonged sleep disturbance in conjunction with anxiety and depressive symptoms are likely interacting to account for most of the patient's day-to-day cognitive changes and complaints.  Also, while the patient does show a pattern consistent with visual-spatial issues it is hard to place a clear emphasis on these symptoms.  The patient reports that she is having difficulties with her primary vision due to cataracts.  If the patient does not experience any improvement with regard to visual spatial functioning after her cataract surgery it may be warranted to do repeat testing in approximately 6 to 9 months around these issues.  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 Neuropsychologist

## 2019-04-27 ENCOUNTER — Ambulatory Visit: Payer: Medicare PPO | Admitting: Psychology

## 2019-05-19 ENCOUNTER — Ambulatory Visit: Payer: Medicare Other | Admitting: Psychology

## 2019-06-23 ENCOUNTER — Ambulatory Visit: Payer: Medicare PPO | Admitting: Psychology

## 2019-07-19 ENCOUNTER — Other Ambulatory Visit: Payer: Self-pay

## 2019-07-19 ENCOUNTER — Encounter: Payer: Medicare PPO | Attending: Psychology | Admitting: Psychology

## 2019-07-19 DIAGNOSIS — G4700 Insomnia, unspecified: Secondary | ICD-10-CM | POA: Insufficient documentation

## 2019-07-19 DIAGNOSIS — G4701 Insomnia due to medical condition: Secondary | ICD-10-CM

## 2019-07-19 DIAGNOSIS — R413 Other amnesia: Secondary | ICD-10-CM | POA: Diagnosis present

## 2019-07-19 DIAGNOSIS — F419 Anxiety disorder, unspecified: Secondary | ICD-10-CM | POA: Diagnosis present

## 2019-07-19 DIAGNOSIS — F411 Generalized anxiety disorder: Secondary | ICD-10-CM | POA: Diagnosis present

## 2019-07-19 DIAGNOSIS — R269 Unspecified abnormalities of gait and mobility: Secondary | ICD-10-CM | POA: Diagnosis present

## 2019-07-19 DIAGNOSIS — F329 Major depressive disorder, single episode, unspecified: Secondary | ICD-10-CM | POA: Diagnosis present

## 2019-07-23 ENCOUNTER — Encounter: Payer: Self-pay | Admitting: Psychology

## 2019-07-23 NOTE — Progress Notes (Addendum)
INITIAL PSYCHOLOGICAL SCREEN/PLAN  Patient ID: Ashley Whitehead, female    DOB: [birth year], 74 y.o.   MRN: [id number]   Reason for Referral:  Ashley Whitehead is a 74 year old, Caucasian, American, female who was referred for individual therapy by Delbert Harness, MD (PCP) for chronic symptoms of anxiety and difficulty falling asleep, which have increased in severity over the past 8 years.  Sources of Information Medical Records available from [facility] electronic medical records were reviewed. Ashley Whitehead was also interviewed.  Chief Complaint: Ashley Whitehead reported elevated symptoms of anxiety and sleep disturbance that are present most days of the week. She denied problems staying asleep. 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 Per her report, these symptoms contributed to a notable decrease in her social activity and participation in pleasurable activities causing her to be sedentary for long periods of time.  History of Presenting Problem: The patient describes longstanding difficulties with anxiety and worry and describes having "1,000 thoughts" going through her mind at any given time; this gets worse at night. She reports chronic difficulty relaxing and going to sleep due to not being able to get her "mind to stop."  She denied current panic attacks but endorsed having them in the past.. She described herself as a "fix it person" and stated that she is "always" thinking about ways to solve her own and other people's problems.   On most evenings, she typically watches T.V. until 10pm and then performs her nighttime routine that takes around 15 minutes. She is in bed most days by 10:15pm. She reported that her husband stays  up relatively late watching T.V. and usually gets in bed around 12:30am; she is up at this time. She purportedly does not fall asleep until 1am or 2am and sometimes even 4am. She described feeling bored often when lying in bed at night and, though she denied watching T.V., she admitted to using her phone for entertainment. She also mentioned that she uses this time to pray for people in her life and to ultimately fall asleep. She reportedly attempts to comfort herself when having problems falling asleep by saying, "its ok, I'm just resting. Please lord let me fall asleep"; this does not help. She reported having a hard time getting out of bed in the morning because of her sleep issues and denied feeling rested upon waking. The patient reports that she gets up in the morning around 10 AM or later. She has an appointment with Dr. Jerre Simon, a pulmonologist, on 07/28/19 to assess for sleep apnea.   She expressed desire to eat dinner earlier in the evening, such as between 6pm and 7pm, rather than their usual time of 7:30 or 8:00pm. She believes this may help prepare her body for rest at an earlier time and help get to sleep before midnight. She also reported feeling better after recently losing 7 lbs. and expressed desire to lose more weight (approx. 8-10 lbs.). In addition, she also discussed desire for more intimacy in her marriage, particularly physical touch rather than sexual intercourse.   She is extremely close with her grandchildren and anticipates having a hard time adjusting to her youngest leaving for college.   She denied participating in counseling or therapy in the past  to work on her own mental health. She attended some parent counseling sessions with her current husband to learn ways to manage their son's substance abuse (e.g., currently clean/sober for 10+ years).   Current Signs/Symptoms Mood: Anxious but bright and mostly euthymic.  Sleep: Trouble falling but not staying asleep. Appetite: Good   Energy level: Reduced  Suicidal ideas: Credibly denied. Homicidal ideas: Credibly denied. Anxiety: Longstanding. Difficulty controlling worry.  Impulse Control: Denied. Hallucinations: Denied. Delusions: Denied. Other: N/A Substance abuse: Denied.  Risk of Suicide/Violence Low: [name] reported no thoughts of suicide or homicide.  Patient Information  See previous neuropsychology consultation note dated 03/03/19 by Ilean Skill, Psy.D. for patient's past medical, psychiatric, and social (marital, occupational, educational, etc.) history.   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 Butkiewicz will participate in 8 individual therapy sessions (31min; 1 x week) to treat symptoms of anxiety and sleep disorder (insosmnia type). Additional therapy sessions may be scheduled based on patient's progress towards treatment goals, severity of symptoms, and/or medical need. Next individual therapy session is scheduled for 07/27/19 at 11:00AM  The patient would likely benefit from individual psychotherapy to address her symptoms of anxiety and trouble falling asleep at a reasonable hour.   Treatment will utilize a cognitive-behavioral approach that incorporates several strategies including the following:   1. Sleep-hygiene education: Goal is to provide guidelines for improving sleep-related behaviors (e.g., avoid napping during the day, avoiding caffeine in the evenings, avoiding strenuous physical and mental activity just before bedtime)  2. Stimulus Control: Goal is to strengthen bed and bedroom as cues for sleep and weaken them as cues for other activities.   3. Relaxation Training: Goal is to reduce anxiety and stress and promote mental and physical relaxation.   4. Cognitive Therapy: Goal: correct faulty attitudes and beliefs about sleep and counterproductive sleep  strategies.   Other treatment goals that may be relevant for the client include increasing her pleasurable activities and social contact, decreasing worry, and assisting with healthy weight loss plan (e.g., diet, exercise, eating behavior, etc.)    ___________________________ Joelyn Oms, Psy.D. Neuropsychologist

## 2019-07-27 ENCOUNTER — Encounter: Payer: Medicare PPO | Admitting: Psychology

## 2019-07-27 ENCOUNTER — Other Ambulatory Visit: Payer: Self-pay

## 2019-07-27 DIAGNOSIS — G4701 Insomnia due to medical condition: Secondary | ICD-10-CM | POA: Diagnosis not present

## 2019-07-27 DIAGNOSIS — F411 Generalized anxiety disorder: Secondary | ICD-10-CM | POA: Diagnosis not present

## 2019-07-27 DIAGNOSIS — R413 Other amnesia: Secondary | ICD-10-CM | POA: Diagnosis not present

## 2019-08-01 ENCOUNTER — Encounter: Payer: Self-pay | Admitting: Psychology

## 2019-08-01 NOTE — Progress Notes (Signed)
Psychology Progress Note    Patient:                       Ashley Whitehead    DOB:                           12-Jun-1945   MR Number:               [id number]   Location:                    [facility] FOR PAIN AND [facility] MEDICINE [facility] PHYSICAL MEDICINE AND REHABILITATION [address] [place], [address] [id number] [facility] [place] [address]   Dept: [phone]   Date of Service:                     07/27/19    Start Time:                             11:00 AM End Time:                               12:03 PM   Provider/Observer:               Thayer Headings, Psy.D., Clinical Neuropsychologist     Billing Code/Service:            96158/96159   Reason for Referral:             [name] is a 74 year old, Caucasian, American, female who was referred for individual therapy by Delbert Harness, MD (PCP) for chronic symptoms of anxiety and difficulty falling asleep, which have increased in severity over the past 8 years.    Chief Complaint:       [name] reported elevated symptoms of anxiety and sleep disturbance that are present most days of the week. She denied problems staying asleep. 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 Per her report, these symptoms contributed to a notable decrease in her social activity and participation in pleasurable activities causing her to be sedentary for long periods of time.    Goal of Session:  Sleep-hygiene education, stimulus control, and introduction to the cognitive model & breathing retraining  Content of Session:  Therapy began with an assessment of her current mood. She reported feeling somewhat sad and powerless after recently learning that her ex-husband was  struggling with his mental health but self-rated her overall mood as a 6 or 7/10. We reviewed her sleep behaviors from the previous week. She reportedly tried to go to bed between 9:30pm-10:30pm each night but did not end up falling asleep until 1:00am-2:00am. She continued to describe increased worrying at night (e.g., "who do I need to fix") that exacerbates sleep difficulties. She mentioned having somewhat less difficulty falling asleep once her husband comes into bed (e.g., 12:30 - 1:00am). She recalled having similar problems getting to bed while her children were growing up due to worrying about their wellbeing (e.g., "I can't rest until everyone is in their place". I restated and reflected on  this thought with patient and inquired about any associated emotions, to which she reported anxious and tense. I used this example to introduce her the cognitive model and begin working on her short term goal of reducing time spent worrying, particularly at night. We reviewed the cognitive model. I used a visual diagram to show the interconnectivity between thought's, feelings, and behavior. I gave her a therapy binder to compile all printed material, behavioral exercises, and other therapy assignments and worksheets.  I provided patient with printed material on sleep hygiene to provide guidelines for improving sleep-related behaviors (e.g., avoid napping during the day, avoiding caffeine in the evenings, avoiding strenuous physical and mental activity just before bedtime, get out of bed if not tired after 15 minutes, etc.). We also reviewed sleep stimulus control instructions and encouraged her to try to follow these suggestions during the upcoming week:    Marland Kitchen Do not use your bed or bedroom for any activity other than sleep.  You should not watch television, read, talk on the telephone, worry, argue with your spouse, or eat in bed.  The only exception to this rule is that you may engage in sexual activity in  bed.  . Establish a set of regular pre-sleep routines to signal that bedtime approaches.  Lock the door, brush teeth, set the alarm, and perform any other behaviorsthat make sense for this time of night.  Do these activities in the same order each night.  Use your preferred sleep posture and combination of favorite pillows and blankets.  . When you get into bed, turn out the lights with the intention of going to sleep.  If you cannot fall asleep within 15 minutes get up and go into another room.  Engage in some quiet activity until you begin to feel drowsy and then return to the bedroom to sleep.  . If you still do not fall asleep within a brief time, repeat the previous step.  Repeat this process as often as it is necessary throughout the night.  Use this same procedure if you awaken in the middle of the night and do not return to sleep with-in 15 minutes.  I introduced patient to diaphragmatic breathing towards the end of the session and provided her with visual material to reference at home. I encouraged patient to practice for around 5 minutes in session and then re-assessed her anxiety and mood. She admitted to feeling less anxious and more relaxed compared to when she first came in. We reflected on this change and discussed goals and homework for the next session.   Behavioral Observations:   Patient was polite, attentive, and appreciative. Thought process was logical and goal directed. Thought content was unremarkable. She denied any suicidal ideation, plan, or intent. Mood was bright and euthymic. Affect was full and congruent with mood.    Assessment/Plan:   [name] will continue participating in weekly or biweekly individual therapy sessions ( 2-4 times per month) to treat symptoms of anxiety and sleep disorder (insomnia type).      Treatment will utilize a cognitive-behavioral approach that incorporates several strategies including the following:    1. Sleep-hygiene education:  Goal is to provide guidelines for improving sleep-related behaviors (e.g., avoid napping during the day, avoiding caffeine in the evenings, avoiding strenuous physical and mental activity just before bedtime)   2. Stimulus Control: Goal is to strengthen bed and bedroom as cues for sleep and weaken them as cues for other activities.    3. Relaxation Training:  Goal is to reduce anxiety and stress and promote mental and physical relaxation.    4. Cognitive Therapy: Goal: correct faulty attitudes and beliefs about sleep and counterproductive sleep strategies.    Other treatment goals that may be relevant for the client include increasing her pleasurable activities and social contact, decreasing worry, and assisting with healthy weight loss plan (e.g., diet, exercise, eating behavior, etc.).   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 Clinical Psychologist  Neuropsychologist

## 2019-08-05 ENCOUNTER — Encounter: Payer: Medicare PPO | Admitting: Psychology

## 2019-08-09 ENCOUNTER — Encounter: Payer: Medicare PPO | Admitting: Psychology

## 2019-08-09 ENCOUNTER — Other Ambulatory Visit: Payer: Self-pay

## 2019-08-09 DIAGNOSIS — G4701 Insomnia due to medical condition: Secondary | ICD-10-CM | POA: Diagnosis not present

## 2019-08-09 DIAGNOSIS — R413 Other amnesia: Secondary | ICD-10-CM | POA: Diagnosis not present

## 2019-08-09 DIAGNOSIS — F411 Generalized anxiety disorder: Secondary | ICD-10-CM | POA: Diagnosis not present

## 2019-08-12 ENCOUNTER — Encounter: Payer: Self-pay | Admitting: Psychology

## 2019-08-12 NOTE — Progress Notes (Signed)
NEUROPSYCHOLOGY VISIT    Patient:Ashley Whitehead   DOB:[birth year]  MR BOFBPZ:[id number]  Location:[facility] FOR PAIN AND [facility] MEDICINE [facility] PHYSICAL MEDICINE AND REHABILITATION [address] [place], [place] [address] [id number] [facility] [place] [address]  Dept: [phone]  Date of Service:   08/09/19  Start Time:    11:00 AM End Time:    12:18 PM  Provider/Observer:   Thayer Headings, Psy.D., Clinical Neuropsychologist  Billing Code/Service:  96158/96159  Reason for Referral:  [name] is a 74 year old, Caucasian, American, female who was referred for individual psychotherapy by Delbert Harness, MD (PCP)for chronic symptoms of anxiety and difficulty falling asleep, which have increased in severity over the past 8 years.  Chief Complaint:   Generalized anxiety and sleep disturbance (insomnia type)  Goal of Session:   Continue sleep-hygiene education, review cognitive model, practice diaphragmatic breathing, introduce sleep diary  Content of Session:  Therapy began with an assessment of her current mood. She expressed some frustration due to spending most of her vacation in bed with a UTI but endorsed some relief now she was feeling better. She denied engaging in many sleep hygiene practices during the previous week due to feeling unwell, but did mention that she stopped napping throughout the day, which has noticeable helped. She also admitted to practicing breathing exercises that reportedly "worked a little but"  to reduce anxious distress. I provided support and validation for patient's distress and praised her decision to utilize a relaxation technique to actively cope with her situation. I inquired about her experience and encouraged her to discuss both positive and challenging aspects. She agreed to demonstrate diaphragmatic breathing in session to allow for  feedback on her technique. Patient appeared to do a better job expanding stomach out against her hand (e.g., while breathing in though nose) while keeping other hand still on upper chest. However, she had some trouble using pursed lips to exhale (slowly) continuously. I provided positive and critical feedback on her technique and then did a live demonstration. We then spend around 5 minutes practicing in session together. Similar to previous session, she admitted to feeling less anxious and more relaxed compared to when she first came in. We reflected on this change.   I reviewed the cognitive model introduced during the previous session. I used a visual diagram to show the interconnectivity between thought's, feelings, and behavior. I introduced thought log and sleep diary. She appeared motivated to complete these tasks throughout the week and expressed willingness to comply.   Behavioral Observations:  Patient was polite, attentive, andappreciative. Thought process was logical and goal directed. Thought content wasunremarkable. She denied any suicidal ideation, plan, or intent. Moodwasbright and euthymic. Affect was full and congruent with mood.   Assessment/Plan:   [name] will continue participating in weekly or biweekly individual therapy sessions ( 2-4 times per month) to treat symptoms of anxiety and sleep disorder (insomnia type). She is making good progress toward meeting goals and is practicing positive coping skills outside of the session. Treatment will continue to utilize a cognitive-behavioral approach that incorporates several strategies including the following:   1. Sleep-hygiene education: Goal is to provide guidelines for improving sleep-related behaviors (e.g., avoid napping during the day, avoiding caffeine in the evenings, avoiding strenuous physical and mental activity just before bedtime)  2. Stimulus Control:Goal is tostrengthen bed and bedroom as cues for sleep  and weaken them as cues for other activities.   3. Relaxation Training:Goal is to reduce anxiety and stress and  promote mental and physical relaxation.   4. Cognitive Therapy:Goal: correct faulty attitudes and beliefs about sleep and counterproductive sleep strategies.   Other treatment goals that may be relevant for the client include increasing her pleasurable activities and social contact, decreasing worry, and assisting with healthy weight loss plan (e.g., diet, exercise, eating behavior, etc.).  Medical Necessity:   The desired outcome and level of functioning has not been restored as outlined in the treatment plan.   Short-Term Goal (STG):  1. 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 Clinical Psychologist  Neuropsychologist

## 2019-08-17 ENCOUNTER — Other Ambulatory Visit: Payer: Self-pay

## 2019-08-17 ENCOUNTER — Ambulatory Visit: Payer: Medicare PPO | Admitting: Psychology

## 2019-08-17 ENCOUNTER — Encounter: Payer: Medicare PPO | Attending: Psychology | Admitting: Psychology

## 2019-08-17 DIAGNOSIS — G4701 Insomnia due to medical condition: Secondary | ICD-10-CM | POA: Diagnosis not present

## 2019-08-17 DIAGNOSIS — R269 Unspecified abnormalities of gait and mobility: Secondary | ICD-10-CM | POA: Insufficient documentation

## 2019-08-17 DIAGNOSIS — F329 Major depressive disorder, single episode, unspecified: Secondary | ICD-10-CM | POA: Diagnosis present

## 2019-08-17 DIAGNOSIS — F419 Anxiety disorder, unspecified: Secondary | ICD-10-CM | POA: Diagnosis present

## 2019-08-17 DIAGNOSIS — R413 Other amnesia: Secondary | ICD-10-CM | POA: Diagnosis present

## 2019-08-17 DIAGNOSIS — G4700 Insomnia, unspecified: Secondary | ICD-10-CM | POA: Diagnosis not present

## 2019-08-17 DIAGNOSIS — F411 Generalized anxiety disorder: Secondary | ICD-10-CM

## 2019-08-22 ENCOUNTER — Encounter: Payer: Self-pay | Admitting: Psychology

## 2019-08-22 NOTE — Progress Notes (Signed)
NEUROPSYCHOLOGY VISIT    Patient:Ashley Whitehead   DOB:[birth year]  MR LOVFIE:[id number]  Location:[facility] FOR PAIN AND [facility] MEDICINE [facility] PHYSICAL MEDICINE AND REHABILITATION [address] [place], [place] [address] [id number] [facility] [place] [address]  Dept: [phone]  Date of Service:   08/17/19  Start Time:    4:00 PM End Time:    5:18 PM  Provider/Observer:   Thayer Headings, Psy.D., Clinical Neuropsychologist  Billing Code/Service:  96158/96159  Reason for Referral:  Ashley Whitehead is a 74 year old, Caucasian, American, female who was referred for individual psychotherapy by Delbert Harness, MD (PCP)for chronic symptoms of anxiety and difficulty falling asleep, which have increased in severity over the past 8 years.  Chief Complaint:   Generalized anxiety and sleep disturbance (insomnia type)  Goal of Session:   Review sleep diary and homework, practice diaphragmatic breathing, introduce sleep restriction, assign homework, plan/schedule additional therapy sessions   Content of Session:  Therapy began with an assessment of her current mood. She expressed an increase in positive mood and appeared joyful. She purportedly had fun at a wedding that she was not looking forward to attending, and was happy to report her her dog was healthy and safe upon picking him up from his "staycation". We reflected on some previous negative predictions she made in light of this information and the amount of time spent worrying about these issues during the previous week.    She completed her weekly sleep diary and recorded many sleep hygiene practices during the week, with the exception of physically getting out of bed if awake after 15-20 minutes of laying down to sleep. I introduced concept of sleep restriction (stimulus control) and discussed rationale for treatment. She agreed to  start this phase of treatment next week. I created a customized sleep log (diary) for her to record times she goes to bed, wakes up during the night/morning, total number of hours slept, approximate time it took to fall asleep, rating of the quality, description of mood/enegery level upon wakening, and any specific problems/complications during the night. She appeared motivated to complete these tasks throughout the week and expressed willingness to comply.   We spent around 5 minutes practicing diaphragmatic breathing. Patient did a better job contracting her abdominal muscles and expanding stomach more than chest. She continued to have some some trouble using pursed lips to exhale (slowly) continuously. I provided positive and critical feedback on her technique and then did a live demonstration. We then spend another 2-3 minutes practicing together. Consistent with previous session, she admitted to feeling less anxious and more relaxed compared to when she first came in. We reflected on this change.    Behavioral Observations:  Patient was polite, attentive, andappreciative. Thought process was logical and goal directed. Thought content wasunremarkable. She denied any suicidal ideation, plan, or intent. Moodwasbright and euthymic. Affect was full and congruent with mood.   Assessment/Plan:   Ashley Whitehead will continue participating in weekly or biweekly individual therapy sessions ( 2-4 times per month) to treat symptoms of anxiety and sleep disorder (insomnia type). She is making good progress toward meeting goals and is currently in the Stimulus Control Phase positive coping skills outside of the session. Treatment will continue to utilize a cognitive-behavioral approach that incorporates several strategies including the following:   1. Sleep-hygiene education: Goal is to provide guidelines for improving sleep-related behaviors (e.g., avoid napping during the day, avoiding caffeine in the  evenings, avoiding strenuous physical and mental activity just before bedtime)  2. Stimulus Control:Goal is tostrengthen bed and bedroom as cues for sleep and weaken them as cues for other activities.   3. Relaxation Training:Goal is to reduce anxiety and stress and promote mental and physical relaxation.   4. Cognitive Therapy:Goal: correct faulty attitudes and beliefs about sleep and counterproductive sleep strategies.   Other treatment goals that may be relevant for the client include increasing her pleasurable activities and social contact, decreasing worry, and assisting with healthy weight loss plan (e.g., diet, exercise, eating behavior, etc.).  Medical Necessity:   The desired outcome and level of functioning has not been restored as outlined in the treatment plan.   Short-Term Goal (STG):  1. In bed Sleeping by 12:00am      2. Get out of bed each morning by 9:00 AM.   Long Term Goal (LTG):  1. In Bed sleeping by 10:30-11:00AM  Home work Assignment:  Practice Diaphragmatic breathing at least once per day for at least 5 minutes   Complete sleep diary on daily basis   Practice sleep restriction each night for 1 week  Sleep Restriction:    When you get into bed, turn out the lights with the intention of going to sleep.  If you cannot fall asleep within 15 minutes get up and go into another room.  Engage in some quiet activity until you begin to feel drowsy and then return to the bedroom to sleep.   If you still do not fall asleep within a brief time, repeat the previous step.  Repeat this process as often as it is necessary throughout the night.  Use this same procedure if you awak-en in the middle of the night and do not return to sleep with-in 15 minutes.   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 Clinical Psychologist  Neuropsychologist

## 2019-09-05 ENCOUNTER — Encounter: Payer: Medicare PPO | Admitting: Psychology

## 2019-09-05 ENCOUNTER — Other Ambulatory Visit: Payer: Self-pay

## 2019-09-05 DIAGNOSIS — R413 Other amnesia: Secondary | ICD-10-CM | POA: Diagnosis not present

## 2019-09-05 DIAGNOSIS — G4701 Insomnia due to medical condition: Secondary | ICD-10-CM | POA: Diagnosis not present

## 2019-09-05 DIAGNOSIS — F411 Generalized anxiety disorder: Secondary | ICD-10-CM | POA: Diagnosis not present

## 2019-09-08 ENCOUNTER — Encounter: Payer: Self-pay | Admitting: Psychology

## 2019-09-08 NOTE — Progress Notes (Signed)
NEUROPSYCHOLOGY VISIT    Patient:Ashley Whitehead   DOB:1945/04/11  MR ZTIWPY:[id number]  Location:[facility] FOR PAIN AND [facility] MEDICINE [facility] PHYSICAL MEDICINE AND REHABILITATION [address] [place], [place] [address] [id number] [facility] [place] [address]  Dept: [phone]  Date of Service:   09/05/19  Start Time:    11:00 AM End Time:    12:00 PM  Provider/Observer:   Thayer Headings, Psy.D., Clinical Neuropsychologist  Billing Code/Service:  96158/96159  Reason for Referral:  [name] is a 74 year old, Caucasian, American, female who was referred for individual psychotherapy by Delbert Harness, MD (PCP)for chronic symptoms of anxiety and difficulty falling asleep, which have increased in severity over the past 8 years.  Chief Complaint:   Generalized anxiety and sleep disturbance (insomnia type)  Goal of Session:   Review sleep diary and homework (e.g., stimulus control ), practice diaphragmatic breathing, introduce sleep restriction, assign homework, review progress.  Content of Session:  Therapy began with an assessment of her current mood. She appeared bright and self-rated her mood as a 8/10. She mentioned that her grandchildren stayed at her house for the past week and that she enjoyed spending time with them. She also reported having less trouble getting to bed during their visit, which she attributed to feeling tired at the end of the day. She is looking forward to their next week long visit towards the end of august.   She denied practicing stimulus control but reportedly adhered to other helpful sleep hygiene suggestions (e.g., avoided naps, etc.).   We spent time reviewing her previous nights sleep. She attempted to go to bed around 10:30 but found her mind racing. She purportedly tried some relaxation techniques that did not seem to help. She admitted  to worrying about performing the exercise(s) "the right way", which triggered a chain of related and unrelated worries. She recalled trying to pray for a few minutes before realizing she was running into similar issues (e.g., prayer for one leads to another, and another, etc.). She eventually got out of bed after around 30 minutes and decided to read a book in bed. She recalled turning the lights off around 11:30 and falling asleep sometime before 12:00am; she woke up to her alarm.  She agreed to start this phase of treatment next week. I created a customized sleep log (diary) for her to record times she goes to bed, wakes up during the night/morning, total number of hours slept, approximate time it took to fall asleep, rating of the quality, description of mood/enegery level upon wakening, and any specific problems/complications during the night. She appeared motivated to complete these tasks throughout the week and expressed willingness to comply.   We spent around 5 minutes practicing diaphragmatic breathing. Patient did a better job contracting her abdominal muscles and expanding stomach more than chest. She continued to have some some trouble using pursed lips to exhale (slowly) continuously. I provided positive and critical feedback on her technique and then did a live demonstration. We then spend another 2-3 minutes practicing together. Consistent with previous session, she admitted to feeling less anxious and more relaxed compared to when she first came in. We reflected on this change.    Behavioral Observations:  Patient was polite, attentive, andappreciative. Thought process was logical and goal directed. Thought content wasunremarkable. She denied any suicidal ideation, plan, or intent. Moodwasbright and euthymic. Affect was full and congruent with mood.   Assessment/Plan:   [name] will continue participating in weekly or biweekly individual  therapy sessions ( 2-4 times per month)  to treat symptoms of anxiety and sleep disorder (insomnia type). She is making good progress toward meeting goals and is currently in the Stimulus Control Phase positive coping skills outside of the session. Treatment will continue to utilize a cognitive-behavioral approach that incorporates several strategies including the following:   1. Sleep-hygiene education: Goal is to provide guidelines for improving sleep-related behaviors (e.g., avoid napping during the day, avoiding caffeine in the evenings, avoiding strenuous physical and mental activity just before bedtime)  2. Stimulus Control:Goal is tostrengthen bed and bedroom as cues for sleep and weaken them as cues for other activities.   3. Relaxation Training:Goal is to reduce anxiety and stress and promote mental and physical relaxation.   4. Cognitive Therapy:Goal: correct faulty attitudes and beliefs about sleep and counterproductive sleep strategies.   Other treatment goals that may be relevant for the client include increasing her pleasurable activities and social contact, decreasing worry, and assisting with healthy weight loss plan (e.g., diet, exercise, eating behavior, etc.).  Medical Necessity:   The desired outcome and level of functioning has not been restored as outlined in the treatment plan.   Short-Term Goal (STG):  1. In bed Sleeping by 12:00am      2. Get out of bed each morning by 9:00 AM.   Long Term Goal (LTG):  1. In Bed sleeping by 10:30-11:00AM  Home work Assignment:  Practice Diaphragmatic breathing at least once per day for at least 5 minutes   Complete sleep diary on daily basis   Practice sleep restriction each night for 1 week  Sleep Restriction:    When you get into bed, turn out the lights with the intention of going to sleep.  If you cannot fall asleep within 15 minutes get up and go into another room.  Engage in some quiet activity until you begin to feel drowsy and then return to the  bedroom to sleep.   If you still do not fall asleep within a brief time, repeat the previous step.  Repeat this process as often as it is necessary throughout the night.  Use this same procedure if you awak-en in the middle of the night and do not return to sleep with-in 15 minutes.   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 Clinical Psychologist  Neuropsychologist

## 2019-09-14 ENCOUNTER — Encounter: Payer: Medicare PPO | Admitting: Psychology

## 2019-09-15 ENCOUNTER — Encounter: Payer: Medicare PPO | Attending: Psychology | Admitting: Psychology

## 2019-09-15 ENCOUNTER — Other Ambulatory Visit: Payer: Self-pay

## 2019-09-15 DIAGNOSIS — G4701 Insomnia due to medical condition: Secondary | ICD-10-CM | POA: Diagnosis not present

## 2019-09-15 DIAGNOSIS — R269 Unspecified abnormalities of gait and mobility: Secondary | ICD-10-CM | POA: Diagnosis present

## 2019-09-15 DIAGNOSIS — F419 Anxiety disorder, unspecified: Secondary | ICD-10-CM | POA: Diagnosis present

## 2019-09-15 DIAGNOSIS — R413 Other amnesia: Secondary | ICD-10-CM | POA: Diagnosis present

## 2019-09-15 DIAGNOSIS — G4700 Insomnia, unspecified: Secondary | ICD-10-CM | POA: Diagnosis not present

## 2019-09-15 DIAGNOSIS — F411 Generalized anxiety disorder: Secondary | ICD-10-CM | POA: Insufficient documentation

## 2019-09-15 DIAGNOSIS — F329 Major depressive disorder, single episode, unspecified: Secondary | ICD-10-CM | POA: Diagnosis present

## 2019-09-20 ENCOUNTER — Encounter: Payer: Medicare PPO | Admitting: Psychology

## 2019-09-25 ENCOUNTER — Encounter: Payer: Self-pay | Admitting: Psychology

## 2019-09-25 NOTE — Progress Notes (Signed)
NEUROPSYCHOLOGY VISIT  Therapy Progress Note    Patient:Ashley Whitehead   DOB:06-08-1945  MR DUKGUR:[id number]  Location:[facility] FOR PAIN AND [facility] MEDICINE [facility] PHYSICAL MEDICINE AND REHABILITATION [address] [place], [place] [address] [id number] [facility] [place] [address]  Dept: [phone]  Date of Service:   09/15/2019  Start Time:    11:00 AM End Time:    12:00 PM  Provider/Observer:   Thayer Headings, Psy.D., Clinical Neuropsychologist  Billing Code/Service:  96158/96159  Reason for Referral:  Ashley Whitehead is a 74 year old, Caucasian, American, female who was referred for individual psychotherapy by Delbert Harness, MD (PCP)for chronic symptoms of anxiety and difficulty falling asleep, which have increased in severity over the past 8 years.  Chief Complaint:   Generalized anxiety and sleep disturbance (insomnia type)  Goal of Session:   Review sleep diary and homework (e.g., stimulus control), practice diaphragmatic breathing, introduce PMR, assign homework, review progress.  Content of Session:  Therapy began with an assessment of her current mood. She appeared bright and self-rated her mood as a 7/10. She did not bring her therapy binder but reportedly completed her sleep diary daily. However, similar to last therapy session, she denied getting out of bed after 15-20 minutes of not sleeping. She also denied understanding the utility or importance of this strategy in helping to reach her short- and long term goal. I reviewed rational for treatment and provided additional education. She appeared to benefit from this and expressed having more understanding and motivation to comply moving forward. She did mention waking up at the same time each morning (9:00am) and feeling more rested, representing a positive change compared to before.   We spent around 5 minutes  practicing diaphragmatic breathing. She continued to demonstrate improvement her abdominal muscles and was observed expanding stomach more than chest. She had less trouble exhaling with pursed lips. I provided positive feedback on her technique and then introduced progressive muscle relaxation exercise. We spend around 10 minutes performing this together. She admitted to feeling less tense and more relaxed compared to when she first came in. We reflected on this change. Homework was assigned and we reviewed short term goals.    Behavioral Observations:  Patient was polite, attentive, andappreciative. Thought process was logical and goal directed. Thought content wasunremarkable. She denied any suicidal ideation, plan, or intent. Moodwasbright and euthymic. Affect was full and congruent with mood.   Assessment/Plan:   Ashley Whitehead will continue participating in weekly or biweekly individual therapy sessions ( 2-4 times per month) to treat symptoms of anxiety and sleep disorder (insomnia type). She is making progress toward meeting goals but had some difficulty complying with an aspect of the stimulus control phase. Review of rational and additional education were provided to emphasize the importance of this treatment strategy in helping her reach her short-and long term goals. She appeared to benefit from this and expressed having more undersdtanding and motivation to comply during the upcoming week. She is currently in the Stimulus Control Phase of treatment. Treatment will continue to utilize a cognitive-behavioral approach that incorporates several strategies including the following:   1. Sleep-hygiene education: Goal is to provide guidelines for improving sleep-related behaviors (e.g., avoid napping during the day, avoiding caffeine in the evenings, avoiding strenuous physical and mental activity just before bedtime)  2. Stimulus Control:Goal is tostrengthen bed and bedroom as cues for sleep  and weaken them as cues for other activities.   3. Relaxation Training:Goal is to reduce anxiety and stress  and promote mental and physical relaxation.   4. Cognitive Therapy:Goal: correct faulty attitudes and beliefs about sleep and counterproductive sleep strategies.   Other treatment goals that may be relevant for the client include increasing her pleasurable activities and social contact, decreasing worry, and assisting with healthy weight loss plan (e.g., diet, exercise, eating behavior, etc.).  Medical Necessity:   The desired outcome and level of functioning has not been restored as outlined in the treatment plan.   Short-Term Goal (STG):  1. In bed Sleeping by 12:00am      2. Get out of bed each morning by 9:00 AM.   Long Term Goal (LTG):  1. In Bed sleeping by 10:30-11:00AM  Home work Assignment:  Practice Diaphragmatic breathing at least once per day for at least 5 minutes  Perform progressive muscle relaxation at least 1 x week for around 5-10 minutes.   Complete sleep diary on daily basis   Practice sleep restriction each night for 1 week   Sleep Restriction:    When you get into bed, turn out the lights with the intention of going to sleep.  If you cannot fall asleep within 15 minutes get up and go into another room.  Engage in some quiet activity until you begin to feel drowsy and then return to the bedroom to sleep.   If you still do not fall asleep within a brief time, repeat the previous step.  Repeat this process as often as it is necessary throughout the night.  Use this same procedure if you awak-en in the middle of the night and do not return to sleep with-in 15 minutes.   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 Clinical Psychologist  Neuropsychologist

## 2019-09-29 ENCOUNTER — Encounter: Payer: Medicare PPO | Admitting: Psychology

## 2019-10-13 ENCOUNTER — Other Ambulatory Visit: Payer: Self-pay

## 2019-10-13 ENCOUNTER — Encounter: Payer: Medicare PPO | Admitting: Psychology

## 2019-10-14 ENCOUNTER — Encounter: Payer: Medicare PPO | Attending: Psychology | Admitting: Psychology

## 2019-10-14 DIAGNOSIS — R413 Other amnesia: Secondary | ICD-10-CM | POA: Diagnosis present

## 2019-10-14 DIAGNOSIS — G4701 Insomnia due to medical condition: Secondary | ICD-10-CM

## 2019-10-14 DIAGNOSIS — G4700 Insomnia, unspecified: Secondary | ICD-10-CM | POA: Insufficient documentation

## 2019-10-14 DIAGNOSIS — F329 Major depressive disorder, single episode, unspecified: Secondary | ICD-10-CM | POA: Diagnosis present

## 2019-10-14 DIAGNOSIS — F419 Anxiety disorder, unspecified: Secondary | ICD-10-CM | POA: Diagnosis present

## 2019-10-14 DIAGNOSIS — R269 Unspecified abnormalities of gait and mobility: Secondary | ICD-10-CM | POA: Insufficient documentation

## 2019-10-14 DIAGNOSIS — F411 Generalized anxiety disorder: Secondary | ICD-10-CM | POA: Diagnosis present

## 2019-10-17 ENCOUNTER — Encounter: Payer: Self-pay | Admitting: Psychology

## 2019-10-20 ENCOUNTER — Encounter: Payer: Medicare PPO | Admitting: Psychology

## 2019-10-26 ENCOUNTER — Encounter: Payer: Medicare PPO | Admitting: Psychology

## 2019-10-27 ENCOUNTER — Encounter: Payer: Self-pay | Admitting: Psychology

## 2019-10-27 NOTE — Progress Notes (Addendum)
   NEUROPSYCHOLOGY VISIT  Therapy Progress Note    [place]   DOB:05-30-1945  MR [id number]  [place] FOR PAIN AND [facility] MEDICINE [facility] PHYSICAL MEDICINE AND REHABILITATION [place], [place] [id number] [place] [address]  Dept: [phone]  Date of Service:   10/14/2019  Start Time:    1:00 PM End Time:    2:00 PM  Provider/Observer:   Joelyn Oms, Psy.D.  Billing Code/Service:  96158/96159  Reason for Referral:  [name] is a 74 year old, Caucasian, [place], female who was referred for individual psychotherapy by Suzanna Obey, MD (PCP)for chronic symptoms of anxiety and difficulty falling asleep, which have increased in severity over the past 8 years.   Chief Complaint:   Generalized anxiety and sleep disturbance (insomnia type)  Goal of Session:   Review sleep diary and homework (e.g., stimulus control), review/practice PMR, assign homework  Content of Session:  She reported her current mood as worried due to thinking about her fathers current health status.   She appeared less bright and self-rated her mood as a 5/10. She brought her therapy binder and showed a partially competed sleep diary. Unlike previous weeks, she adhered to suggestion that she get out of bed after 15-20 minutes of not sleeping 4/7 days during previous week and admitted that this improved the duration and quality of sleep on those occasions. She continued to acknowledge waking up at the same time each morning (9:00am) and feeling more rested upon waking; she feels more energy during the day. She did described worsening vertigo over the last 1-2 months (daily) and intermittent headaches which impacts her mood and activity level when severe.   We spent around 10-15 minutes reviewing and practicing progressive muscle relaxation.  She claimed to feel less tense and more relaxed compared to when she first came in.   Behavioral Observations:  Patient was polite, attentive, andappreciative. Thought process was logical and goal directed. Thought content wasunremarkable. She denied any suicidal ideation, plan, or intent. Moodwasbright and euthymic. Affect was full and congruent with mood.   Assessment/Plan:   Mrs. Crill will continue biweekly individual therapy sessions (57mn 2 x month) to treat symptoms of generalized anxiety and sleep disorder (insomnia type). She is making progress toward meeting goals and recent began complying with stimulus control techniques to improve difficulty falling asleep. She is currently in the Stimulus Control & Relaxation Training Phase(s) of treatment. Treatment will continue to utilize a cognitive-behavioral approach that includes: Stimulus control, relaxation training, and cognitive therapy. Mindfulness will also be taught to reduce time spent worrying and for stress management (per patient's request).   Medical Necessity:   The desired outcome and level of functioning has not been restored as outlined in the treatment plan.   Short-Term Goal (STG):  1. In bed Sleeping by 12:00am for 2 consecutive weeks: Partially met      2. Get out of bed each morning by 9:00 AM: Met   Home work Assignment:  Practice Diaphragmatic breathing at least once per day for at least 10 minutes  Perform progressive muscle relaxation at least 1 x day for around 10-15 minutes.   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 Clinical Psychologist  Neuropsychologist

## 2019-11-03 ENCOUNTER — Encounter (HOSPITAL_BASED_OUTPATIENT_CLINIC_OR_DEPARTMENT_OTHER): Payer: Medicare PPO | Admitting: Psychology

## 2019-11-03 ENCOUNTER — Encounter: Payer: Self-pay | Admitting: Psychology

## 2019-11-03 ENCOUNTER — Other Ambulatory Visit: Payer: Self-pay

## 2019-11-03 DIAGNOSIS — F411 Generalized anxiety disorder: Secondary | ICD-10-CM

## 2019-11-03 DIAGNOSIS — R413 Other amnesia: Secondary | ICD-10-CM | POA: Diagnosis not present

## 2019-11-03 DIAGNOSIS — G4701 Insomnia due to medical condition: Secondary | ICD-10-CM | POA: Diagnosis not present

## 2019-11-03 NOTE — Progress Notes (Signed)
Mrs. Hearld described feeling "much better" and self rated her mood as an 8/10. She attributed this to "finally getting a handle on my sleep" and proudly displayed two (consecutive) weeks of daily sleep diary entries; these were reviewed together in session. Consistent with her report, sleep diaries suggested signification recent improvement in reducing the amount of time it took her to fall asleep and she is routinely waking up at a desirable time (9:00-9:30am), even without an alarm. She mentioned that she is no longer "trying to force myself to sleep" or staying up and worrying about the possible consequences or the likely impact on her energy level the next day.  Education on mindfulness was provided per patient's request and clinical relevance to her reported symptoms and generalized worry and stress. Plan is to teach patient mindfulness skills for the remaining 4 (1 x week) visits.   She reportedly created a [employer] account (e.g., research based cognitive training activities) and is spending around 1-2 hours per day engaged in activities to challenge/improve memory function.   She has an upcoming appointment with Dr. Earnest Bailey due to ongoing vertigo for the past 2-3 months. She may possibly benefit from vestibular rehab therapy or related interventions that target balance, gaze, and gait stabilization.   She described having some recent headaches and stiff neck during today's appointment that might be related to problems with vision; headaches worsen with reading and she endorsed seeing retinal "floaters" somewhat regularly; gets worse with direct sunlight.

## 2019-11-10 ENCOUNTER — Encounter: Payer: Self-pay | Admitting: Psychology

## 2019-11-10 ENCOUNTER — Other Ambulatory Visit: Payer: Self-pay

## 2019-11-10 ENCOUNTER — Encounter (HOSPITAL_BASED_OUTPATIENT_CLINIC_OR_DEPARTMENT_OTHER): Payer: Medicare PPO | Admitting: Psychology

## 2019-11-10 DIAGNOSIS — R413 Other amnesia: Secondary | ICD-10-CM | POA: Diagnosis not present

## 2019-11-10 DIAGNOSIS — F411 Generalized anxiety disorder: Secondary | ICD-10-CM

## 2019-11-10 DIAGNOSIS — G4701 Insomnia due to medical condition: Secondary | ICD-10-CM

## 2019-11-10 NOTE — Progress Notes (Signed)
She appeared more agitated than in previous visits and described feeling frustrated. She attributed this to experiencing persistent vertigo for past 3-4 months with intermittent headaches; she had one yesterday around 12:00PM. She wakes up feeling dizzy and this lasts throughout the day. She begins physical therapy on 12/18/19 to improve balance and vestibular symptoms. She plans on seeing ENT for further assessment of vertigo. She continued to describe her sleep as much improved and regular for first time in years.  Education on mindfulness was continued and she participated in 2 different exercises to practice this skill. She responded positively to this technique and described feeling "much more relaxed and calm". Homework was assigned and she agreed to complete it.   Given her frequent vertigo and headaches, she was encouraged to discuss her current medication regimen with prescribing providers. There are several medications that are prescribed that can cause problems with memory and thinking (e.g., Paxil). Of course, there is a cost-benefit analysis that must be considered carefully and it may not be possible to treat all of the patient's symptoms without some cognitive side effects. She has been taking Paxil for the last 15 years. Her son was abusing drugs at the time and she was experiencing a lot of stress at home. She had no psychiatric history before this time. He has been clean for several years and is no longer a source of stress as their relationship has improved.

## 2019-11-17 ENCOUNTER — Encounter: Payer: Self-pay | Admitting: Physical Therapy

## 2019-11-17 ENCOUNTER — Ambulatory Visit: Payer: Medicare PPO | Attending: Family Medicine | Admitting: Physical Therapy

## 2019-11-17 ENCOUNTER — Other Ambulatory Visit: Payer: Self-pay

## 2019-11-17 DIAGNOSIS — H8111 Benign paroxysmal vertigo, right ear: Secondary | ICD-10-CM | POA: Diagnosis present

## 2019-11-17 NOTE — Patient Instructions (Addendum)
Sit to Side-Lying    Sit on edge of bed. 1. Turn head 45 to right. 2. Maintain head position and lie down slowly on left side. Hold until symptoms subside. 3. Sit up slowly. Hold until symptoms subside. 4. Turn head 45 to left. 5. Maintain head position and lie down slowly on right side. Hold until symptoms subside. 6. Sit up slowly. Repeat sequence __5_ times per session. Do ___3_ sessions per day.  Copyright  VHI. All rights reserved.     How to Perform the Epley Maneuver The Epley maneuver is an exercise that relieves symptoms of vertigo. Vertigo is the feeling that you or your surroundings are moving when they are not. When you feel vertigo, you may feel like the room is spinning and have trouble walking. Dizziness is a little different than vertigo. When you are dizzy, you may feel unsteady or light-headed. You can do this maneuver at home whenever you have symptoms of vertigo. You can do it up to 3 times a day until your symptoms go away. Even though the Epley maneuver may relieve your vertigo for a few weeks, it is possible that your symptoms will return. This maneuver relieves vertigo, but it does not relieve dizziness. What are the risks? If it is done correctly, the Epley maneuver is considered safe. Sometimes it can lead to dizziness or nausea that goes away after a short time. If you develop other symptoms, such as changes in vision, weakness, or numbness, stop doing the maneuver and call your health care provider. How to perform the Epley maneuver 1. Sit on the edge of a bed or table with your back straight and your legs extended or hanging over the edge of the bed or table. 2. Turn your head halfway toward the affected ear or side. 3. Lie backward quickly with your head turned until you are lying flat on your back. You may want to position a pillow under your shoulders. 4. Hold this position for 30 seconds. You may experience an attack of vertigo. This is normal. 5. Turn your  head to the opposite direction until your unaffected ear is facing the floor. 6. Hold this position for 30 seconds. You may experience an attack of vertigo. This is normal. Hold this position until the vertigo stops. 7. Turn your whole body to the same side as your head. Hold for another 30 seconds. 8. Sit back up. You can repeat this exercise up to 3 times a day. Follow these instructions at home:  After doing the Epley maneuver, you can return to your normal activities.  Ask your health care provider if there is anything you should do at home to prevent vertigo. He or she may recommend that you: ? Keep your head raised (elevated) with two or more pillows while you sleep. ? Do not sleep on the side of your affected ear. ? Get up slowly from bed. ? Avoid sudden movements during the day. ? Avoid extreme head movement, like looking up or bending over. Contact a health care provider if:  Your vertigo gets worse.  You have other symptoms, including: ? Nausea. ? Vomiting. ? Headache. Get help right away if:  You have vision changes.  You have a severe or worsening headache or neck pain.  You cannot stop vomiting.  You have new numbness or weakness in any part of your body. Summary  Vertigo is the feeling that you or your surroundings are moving when they are not.  The Epley maneuver  is an exercise that relieves symptoms of vertigo.  If the Epley maneuver is done correctly, it is considered safe. You can do it up to 3 times a day. This information is not intended to replace advice given to you by your health care provider. Make sure you discuss any questions you have with your health care provider. Document Revised: 01/09/2017 Document Reviewed: 12/18/2015 Elsevier Patient Education  2020 [employer].   Self Treatment for Right Posterior / Anterior Canalithiasis    Sitting on bed: 1. Turn head 45 right. (a) Lie back slowly, shoulders on pillow, head on bed. (b) Hold  _20___ seconds. 2. Keeping head on bed, turn head 90 left. Hold __20__ seconds. 3. Roll to left, head on 45 angle down toward bed. Hold _20___ seconds. 4. Sit up on left side of bed. Repeat _3___ times per session. Do _1-2___ sessions per day.

## 2019-11-18 ENCOUNTER — Ambulatory Visit: Payer: Medicare PPO | Attending: Internal Medicine

## 2019-11-18 DIAGNOSIS — Z23 Encounter for immunization: Secondary | ICD-10-CM

## 2019-11-18 NOTE — Progress Notes (Signed)
° °  Covid-19 Vaccination Clinic  Name:  Ashley Whitehead    MRN: [id number] DOB: Jun 14, 1945  11/18/2019  [name] was observed post Covid-19 immunization for 15 minutes without incident. She was provided with Vaccine Information Sheet and instruction to access the V-Safe system.   [name] was instructed to call 911 with any severe reactions post vaccine:  Difficulty breathing   Swelling of face and throat   A fast heartbeat   A bad rash all over body   Dizziness and weakness

## 2019-11-18 NOTE — Therapy (Signed)
[facility] [address] [place], [place], [address] Phone: [phone]   Fax:  [phone]  Physical Therapy Evaluation  Patient Details  Name: Ashley Whitehead MRN: [id number] Date of Birth: [birth year] Referring Provider (PT): Delbert Harness, MD   Encounter Date: 11/17/2019   PT End of Session - 11/18/19 1319    Visit Number 1    Number of Visits 3    Date for PT Re-Evaluation 12/16/19    Authorization Type [employer]    Authorization Time Period 11-17-19 - 01-17-20    PT Start Time 1105    PT Stop Time 1150    PT Time Calculation (min) 45 min    Activity Tolerance Patient tolerated treatment well    Behavior During Therapy [facility] for tasks assessed/performed           Past Medical History:  Diagnosis Date  . Anxiety   . Depression   . GERD (gastroesophageal reflux disease)   . Heart murmur    innocent  . Hypercholesterolemia   . Hypothyroidism   . Osteoporosis   . Renal infection   . Wrist fracture     Past Surgical History:  Procedure Laterality Date  . BREAST SURGERY     lumpectomy  . COLONOSCOPY    . FINGER SURGERY    . ORIF WRIST FRACTURE Left 01/12/2018   Procedure: OPEN REDUCTION INTERNAL FIXATION (ORIF) WRIST FRACTURE;  Surgeon: Dominica Severin, MD;  Location: [facility] OR;  Service: Orthopedics;  Laterality: Left;  60 mins General with block  . TONSILLECTOMY      There were no vitals filed for this visit.    Subjective Assessment - 11/17/19 1116    Subjective Pt reports she started having vertigo approx. 2 months ago - states it seems to be related to her movements; seems to be worse in the am; took Meclizine last week;  sometimes feels it when she lies flat on the bed; tries to be cognizant of how she is moving    Patient Stated Goals resolve the vertigo    Currently in Pain? No/denies              [facility] PT Assessment - 11/18/19 0001      Assessment   Medical Diagnosis Vertigo     Referring Provider (PT) Delbert Harness, MD    Onset Date/Surgical Date --   August 2021   Prior Therapy none      Precautions   Precautions None      Balance Screen   Has the patient fallen in the past 6 months No    Has the patient had a decrease in activity level because of a fear of falling?  No    Is the patient reluctant to leave their home because of a fear of falling?  No      Prior Function   Level of Independence Independent      Observation/Other Assessments   Focus on Therapeutic Outcomes (FOTO)  pt's FS primary measure 81/100     Other Surveys  Dizziness Handicap Inventory (DHI)    Dizziness Handicap Inventory (DHI)  38% (moderate handicap)                   Vestibular Assessment - 11/18/19 0001      Symptom Behavior   Subjective history of current problem pt reports she has had intermittent dizziness with certain movements for past 2 months; pt states she fell and fractured her Lt  wrist in Dec. 2019 and had vertigo after the fall (was treated for this when she was in hospital)     Type of Dizziness  Spinning    Frequency of Dizziness daily but depends on movement    Duration of Dizziness secs to minutes    Symptom Nature Positional    Aggravating Factors Lying supine;Sitting with head tilted back;Rolling to right;Forward bending    Relieving Factors Head stationary    Progression of Symptoms Better    History of similar episodes in Dec. 2019 after fall sustained in which she fractured her Lt wrist       Positional Testing   Dix-Hallpike Dix-Hallpike Right;Dix-Hallpike Left      Dix-Hallpike Right   Dix-Hallpike Right Duration approx 10 secs    Dix-Hallpike Right Symptoms Upbeat, right rotatory nystagmus      Dix-Hallpike Left   Dix-Hallpike Left Duration none    Dix-Hallpike Left Symptoms No nystagmus              Objective measurements completed on examination: See above findings.      Epley maneuver for Rt BPPV performed 3 reps - symptoms  appeared to be fully resolved on 3rd rep         PT Education - 11/18/19 1317    Education Details pt instructed in Epley maneuver for Rt BPPV for self treatment; gave pt article on BPPV etiology from [facility]    Person(s) Educated Patient    Methods Explanation;Demonstration    Comprehension Verbalized understanding;Returned demonstration               PT Long Term Goals - 11/18/19 1328      PT LONG TERM GOAL #1   Title Pt will have a (-) Rt Dix-Hallpike test to indicate resolution of Rt BPPV.    Time 3    Period Weeks    Status New    Target Date 12/09/19      PT LONG TERM GOAL #2   Title Improve DHI from 38%  to </= 16% to indicate improvement in dizziness.    Baseline 38%    Time 3    Period Weeks    Status New    Target Date 12/09/19      PT LONG TERM GOAL #3   Title Independent in HEP for habituation and self treatment prn.    Time 3    Period Weeks    Status New    Target Date 12/09/19                  Plan - 11/18/19 1322    Clinical Impression Statement Pt has (+) Rt Dix-Hallpike test with Rt rotary upbeating nystagmus, indicative of Rt posterior canalithiasis.  Pt was treated with Epley maneuver 3 reps - symptoms appeared to be fully resolved on 3rd rep of Epley.    Personal Factors and Comorbidities Behavior Pattern;Past/Current Experience;Comorbidity 1    Comorbidities anxiety/depression, fall sustained in Dec. 2019 resulting in Lt wrist fracture    Examination-Activity Limitations Bed Mobility;Locomotion Level;Transfers;Bend    Examination-Participation Restrictions Meal Prep;Cleaning;Community Activity;Shop;Laundry    Clinical Decision Making Low    Rehab Potential Good    PT Frequency 1x / week    PT Duration 3 weeks    PT Treatment/Interventions Vestibular;Canalith Repostioning;Gait training;Therapeutic exercise;Therapeutic activities;Balance training;Neuromuscular re-education;ADLs/Self Care Home Management;Patient/family education    PT  Next Visit Plan recheck Rt Dix-Hallpike test    PT Home Exercise Plan Epley for self tx prn:  also Brandt-Daroff exercises issued    Consulted and Agree with Plan of Care Patient           Patient will benefit from skilled therapeutic intervention in order to improve the following deficits and impairments:  Dizziness, Decreased balance, Difficulty walking  Visit Diagnosis: BPPV (benign paroxysmal positional vertigo), right - Plan: PT plan of care cert/re-cert     Problem List Patient Active Problem List   Diagnosis Date Noted  . Colles' fracture of left radius, initial encounter for closed fracture 01/12/2018    Kary Kos, PT 11/18/2019, 1:39 PM  [facility] [address] [place], [place], [address] Phone: [phone]   Fax:  [phone]  Name: BRYLINN TEANEY MRN: [id number] Date of Birth: 1946-01-03

## 2019-11-18 NOTE — Progress Notes (Signed)
Provider not able to attend appointment and requested that patient return tomorrow, which she agreed.

## 2019-11-24 ENCOUNTER — Encounter: Payer: Medicare PPO | Admitting: Psychology

## 2019-12-01 ENCOUNTER — Other Ambulatory Visit: Payer: Self-pay

## 2019-12-01 ENCOUNTER — Ambulatory Visit: Payer: Medicare PPO | Admitting: Physical Therapy

## 2019-12-01 DIAGNOSIS — H8111 Benign paroxysmal vertigo, right ear: Secondary | ICD-10-CM

## 2019-12-02 ENCOUNTER — Encounter: Payer: Self-pay | Admitting: Physical Therapy

## 2019-12-02 NOTE — Therapy (Signed)
[facility] [address] [place], [place], [address] Phone: [phone]   Fax:  [phone]  Physical Therapy Treatment  Patient Details  Name: Ashley Whitehead MRN: [id number] Date of Birth: Oct 11, 1945 Referring Provider (PT): Delbert Harness, MD   Encounter Date: 12/01/2019   PT End of Session - 12/02/19 1351    Visit Number 2    Number of Visits 3    Date for PT Re-Evaluation 12/16/19    Authorization Type [employer]    Authorization Time Period 11-17-19 - 01-17-20    PT Start Time 0855   pt arrived 10" late   PT Stop Time 0930    PT Time Calculation (min) 35 min    Activity Tolerance Patient tolerated treatment well    Behavior During Therapy [facility] for tasks assessed/performed           Past Medical History:  Diagnosis Date   Anxiety    Depression    GERD (gastroesophageal reflux disease)    Heart murmur    innocent   Hypercholesterolemia    Hypothyroidism    Osteoporosis    Renal infection    Wrist fracture     Past Surgical History:  Procedure Laterality Date   BREAST SURGERY     lumpectomy   COLONOSCOPY     FINGER SURGERY     ORIF WRIST FRACTURE Left 01/12/2018   Procedure: OPEN REDUCTION INTERNAL FIXATION (ORIF) WRIST FRACTURE;  Surgeon: Dominica Severin, MD;  Location: [facility] OR;  Service: Orthopedics;  Laterality: Left;  60 mins General with block   TONSILLECTOMY      There were no vitals filed for this visit.   Subjective Assessment - 12/02/19 1350    Subjective Pt states the vertigo is much improved but not totally gone yet - states she was playing with her grandchildren last weekend and it suddenly hit her again    Patient Stated Goals resolve the vertigo    Currently in Pain? No/denies               Neuro Re-ed:  (+) Rt Dix-Hallpike test with rotary upbeating nystagmus and c/o vertigo in test position, indicative of Rt BPPV Posterior canalithiasis   Canalith  Repositioning: Pt was treated with 3 reps of Epley maneuver for Rt BPPV - symptoms appeared to be fully resolved on 3rd rep of Epley's   Reviewed balance exercises - tandem stance, progress to tandem walking for incr. Difficulty,  and SLS for at least 10 secs on each leg                        PT Long Term Goals - 12/02/19 1355      PT LONG TERM GOAL #1   Title Pt will have a (-) Rt Dix-Hallpike test to indicate resolution of Rt BPPV.    Time 3    Period Weeks    Status New      PT LONG TERM GOAL #2   Title Improve DHI from 38%  to </= 16% to indicate improvement in dizziness.    Baseline 38%    Time 3    Period Weeks    Status New      PT LONG TERM GOAL #3   Title Independent in HEP for habituation and self treatment prn.    Time 3    Period Weeks    Status New  Plan - 12/02/19 1353    Clinical Impression Statement Pt had (+) Rt Dix-Hallpike test with rotary, upbeating nystagmus and c/o vertigo in test position - indicative of Rt BPPV posterior canalithiasis.  Symptoms resolved on 3rd rep of maneuver.  Cont to assess and treat BPPV as indicated.    Personal Factors and Comorbidities Behavior Pattern;Past/Current Experience;Comorbidity 1    Comorbidities anxiety/depression, fall sustained in Dec. 2019 resulting in Lt wrist fracture    Examination-Activity Limitations Bed Mobility;Locomotion Level;Transfers;Bend    Examination-Participation Restrictions Meal Prep;Cleaning;Community Activity;Shop;Laundry    Rehab Potential Good    PT Frequency 1x / week    PT Duration 3 weeks    PT Treatment/Interventions Vestibular;Canalith Repostioning;Gait training;Therapeutic exercise;Therapeutic activities;Balance training;Neuromuscular re-education;ADLs/Self Care Home Management;Patient/family education    PT Next Visit Plan recheck Rt Dix-Hallpike test    PT Home Exercise Plan Epley for self tx prn:  also Brandt-Daroff exercises issued     Consulted and Agree with Plan of Care Patient           Patient will benefit from skilled therapeutic intervention in order to improve the following deficits and impairments:  Dizziness, Decreased balance, Difficulty walking  Visit Diagnosis: BPPV (benign paroxysmal positional vertigo), right     Problem List Patient Active Problem List   Diagnosis Date Noted   Colles' fracture of left radius, initial encounter for closed fracture 01/12/2018    Oakley Kossman, Donavan Burnet, PT 12/02/2019, 1:57 PM  [facility] [address] [place], [place], [address] Phone: [phone]   Fax:  [phone]  Name: Ashley Whitehead MRN: [id number] Date of Birth: Jun 16, 1945

## 2019-12-08 ENCOUNTER — Ambulatory Visit: Payer: Medicare PPO | Admitting: Psychology

## 2019-12-15 ENCOUNTER — Ambulatory Visit: Payer: Medicare PPO | Attending: Family Medicine | Admitting: Physical Therapy

## 2019-12-15 ENCOUNTER — Encounter: Payer: Medicare PPO | Admitting: Psychology

## 2019-12-15 DIAGNOSIS — H8111 Benign paroxysmal vertigo, right ear: Secondary | ICD-10-CM | POA: Insufficient documentation

## 2019-12-19 ENCOUNTER — Other Ambulatory Visit: Payer: Self-pay | Admitting: Family Medicine

## 2019-12-19 DIAGNOSIS — Z1231 Encounter for screening mammogram for malignant neoplasm of breast: Secondary | ICD-10-CM

## 2019-12-22 ENCOUNTER — Encounter: Payer: Medicare PPO | Attending: Psychology | Admitting: Psychology

## 2019-12-22 ENCOUNTER — Other Ambulatory Visit: Payer: Self-pay

## 2019-12-22 DIAGNOSIS — G4701 Insomnia due to medical condition: Secondary | ICD-10-CM | POA: Diagnosis present

## 2019-12-22 DIAGNOSIS — F411 Generalized anxiety disorder: Secondary | ICD-10-CM

## 2019-12-29 ENCOUNTER — Encounter: Payer: Medicare PPO | Admitting: Psychology

## 2019-12-29 ENCOUNTER — Encounter: Payer: Self-pay | Admitting: Psychology

## 2019-12-29 NOTE — Progress Notes (Signed)
Subjective:    Patient ID: Ashley Whitehead is a 74 y.o. female.  Chief Complaint: HPI - see previous   Patient reportedly does not want to participate in repeat neuropsychological testing.  She is comfortable with the results from her previous evaluation and would like to practice mindfulness based skills to reduce stress and anxiety instead.  She reports that her sleep has been worse recently and would like to continue working on sleep hygiene and stimulus control techniques. She purportedly took T.V. out of bedroom. She believes elevated stress and anxiety may have something to do with her father's current health problems, very old age, and decreasing functioning (e.g., 74 y.o. w/macular degeneration).    Regarding current medication, she was switched from Paxil to Lexapro around 9 to 10 days ago. Reports increased irritability and agitation; reportedly did not titrate down. She is starting to feel better and less dizzy after attending balance therapy/vestibular rehab.   Objective:  Physical Exam Psychiatric:        Attention and Perception: Perception normal. She is inattentive. She does not perceive auditory or visual hallucinations.        Mood and Affect: Mood is anxious and depressed. Mood is not elated. Affect is labile. Affect is not blunt, flat, angry, tearful or inappropriate.        Speech: She is communicative. Speech is tangential. Speech is not rapid and pressured, delayed or slurred.        Behavior: Behavior is agitated. Behavior is not slowed, aggressive, withdrawn, hyperactive or combative. Behavior is cooperative.        Thought Content: Thought content is not paranoid or delusional. Thought content does not include homicidal or suicidal ideation. Thought content does not include homicidal or suicidal plan.        Cognition and Memory: Cognition and memory normal. Cognition is not impaired. Memory is not impaired. She does not exhibit impaired recent memory or impaired remote  memory.        Judgment: Judgment is impulsive. Judgment is not inappropriate.    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 Self-reported stress and anxiety significantly reduced after participating in 10-15 minute mindful breathing and guided visualization exercise. We practiced separating thoughts from emotions and physical sensations.   Plan:   Continue working on behavioral techniques to improve sleep and and mindfulness based stress reduction to manage current psychosocial stressors.   I spent 60 minutes face-to-face with patient during today's therapy appointment.   Next IND therapy session: 01/17/20

## 2020-01-03 ENCOUNTER — Ambulatory Visit: Payer: Medicare PPO | Admitting: Physical Therapy

## 2020-01-03 ENCOUNTER — Other Ambulatory Visit: Payer: Self-pay

## 2020-01-03 DIAGNOSIS — H8111 Benign paroxysmal vertigo, right ear: Secondary | ICD-10-CM | POA: Diagnosis present

## 2020-01-04 ENCOUNTER — Encounter: Payer: Self-pay | Admitting: Physical Therapy

## 2020-01-04 NOTE — Therapy (Signed)
[facility] [address] [place], [place], [address] Phone: [phone]   Fax:  [phone]  Physical Therapy Treatment  Patient Details  Name: Ashley Whitehead MRN: [id number] Date of Birth: 08-20-1945 Referring Provider (PT): Suzanna Obey, MD   Encounter Date: 01/03/2020   PT End of Session - 01/04/20 1944    Visit Number 3    Authorization Type [employer]    Authorization Time Period 11-17-19 - 01-17-20    PT Start Time 1405    PT Stop Time 1447    PT Time Calculation (min) 42 min    Activity Tolerance Patient tolerated treatment well    Behavior During Therapy [facility] for tasks assessed/performed           Past Medical History:  Diagnosis Date  . Anxiety   . Depression   . GERD (gastroesophageal reflux disease)   . Heart murmur    innocent  . Hypercholesterolemia   . Hypothyroidism   . Osteoporosis   . Renal infection   . Wrist fracture     Past Surgical History:  Procedure Laterality Date  . BREAST SURGERY     lumpectomy  . COLONOSCOPY    . FINGER SURGERY    . ORIF WRIST FRACTURE Left 01/12/2018   Procedure: OPEN REDUCTION INTERNAL FIXATION (ORIF) WRIST FRACTURE;  Surgeon: Roseanne Kaufman, MD;  Location: [facility];  Service: Orthopedics;  Laterality: Left;  60 mins General with block  . TONSILLECTOMY      There were no vitals filed for this visit.   Subjective Assessment - 01/03/20 1410    Subjective Pt states the vertigo is much improved but not totally gone yet - states she was playing with her grandchildren last weekend and it suddenly hit her again    Patient Stated Goals resolve the vertigo    Currently in Pain? No/denies              [facility] PT Assessment - 01/04/20 0001      Functional Gait  Assessment   Gait assessed  Yes    Gait Level Surface Walks 20 ft in less than 5.5 sec, no assistive devices, good speed, no evidence for imbalance, normal gait pattern, deviates no more than 6 in  outside of the 12 in walkway width.   5.2   Change in Gait Speed Able to smoothly change walking speed without loss of balance or gait deviation. Deviate no more than 6 in outside of the 12 in walkway width.    Gait with Horizontal Head Turns Performs head turns smoothly with slight change in gait velocity (eg, minor disruption to smooth gait path), deviates 6-10 in outside 12 in walkway width, or uses an assistive device.    Gait with Vertical Head Turns Performs task with slight change in gait velocity (eg, minor disruption to smooth gait path), deviates 6 - 10 in outside 12 in walkway width or uses assistive device    Gait and Pivot Turn Pivot turns safely within 3 sec and stops quickly with no loss of balance.    Step Over Obstacle Is able to step over 2 stacked shoe boxes taped together (9 in total height) without changing gait speed. No evidence of imbalance.    Gait with Narrow Base of Support Ambulates 7-9 steps.    Gait with Eyes Closed Walks 20 ft, uses assistive device, slower speed, mild gait deviations, deviates 6-10 in outside 12 in walkway width. Ambulates 20 ft in less  than 9 sec but greater than 7 sec.    Ambulating Backwards Walks 20 ft, no assistive devices, good speed, no evidence for imbalance, normal gait    Steps Alternating feet, no rail.    Total Score 26                   SVA line 10;  DVA line 9 (WNL's)           Balance Exercises - 01/04/20 0001      Balance Exercises: Standing   Standing Eyes Opened Narrow base of support (BOS);Wide (BOA);Head turns;Foam/compliant surface;5 reps   horizontal and vertical head turns   Standing Eyes Closed Narrow base of support (BOS);Wide (BOA);Head turns;Foam/compliant surface;5 reps   horizontal and vertical head turns            PT Education - 01/04/20 1944    Education Details reviewed and emphasized importance of HEP (balance on foam)    Person(s) Educated Patient    Methods Explanation     Comprehension Verbalized understanding               PT Long Term Goals - 01/04/20 1946      PT LONG TERM GOAL #1   Title Pt will have a (-) Rt Dix-Hallpike test to indicate resolution of Rt BPPV.    Baseline met 01-03-20    Time 3    Period Weeks    Status Achieved      PT LONG TERM GOAL #2   Title Improve DHI from 38%  to </= 16% to indicate improvement in dizziness.    Baseline 38%    Time 3    Period Weeks    Status On-going      PT LONG TERM GOAL #3   Title Independent in HEP for habituation and self treatment prn.    Baseline pt states she has done exercises inconsistently    Time 3    Period Weeks    Status On-going                 Plan - 01/04/20 1947    Clinical Impression Statement Pt has met LTG #1 as vertigo is resolved;  LTG's #2 & 3 are ongoing as goals are not fully achieved.  Plan 1 more session to check HEP.    Personal Factors and Comorbidities Behavior Pattern;Past/Current Experience;Comorbidity 1    Comorbidities anxiety/depression, fall sustained in Dec. 2019 resulting in Lt wrist fracture    Examination-Activity Limitations Bed Mobility;Locomotion Level;Transfers;Bend    Examination-Participation Restrictions Meal Prep;Cleaning;Community Activity;Shop;Laundry    Rehab Potential Good    PT Frequency 1x / week    PT Duration 3 weeks    PT Treatment/Interventions Vestibular;Canalith Repostioning;Gait training;Therapeutic exercise;Therapeutic activities;Balance training;Neuromuscular re-education;ADLs/Self Care Home Management;Patient/family education    PT Next Visit Plan check HEP, D/C next session    PT Home Exercise Plan Epley for self tx prn:  also Brandt-Daroff exercises issued    Consulted and Agree with Plan of Care Patient           Patient will benefit from skilled therapeutic intervention in order to improve the following deficits and impairments:  Dizziness, Decreased balance, Difficulty walking  Visit Diagnosis: BPPV (benign  paroxysmal positional vertigo), right     Problem List Patient Active Problem List   Diagnosis Date Noted  . Colles' fracture of left radius, initial encounter for closed fracture 01/12/2018    Alda Lea, PT 01/04/2020, 7:50 PM  [facility] [address] [place], [place], [address] Phone: [phone]   Fax:  [phone]  Name: Ashley Whitehead MRN: [id number] Date of Birth: 03-Sep-1945

## 2020-01-12 ENCOUNTER — Ambulatory Visit: Payer: Medicare PPO | Admitting: Psychology

## 2020-01-17 ENCOUNTER — Encounter: Payer: Medicare PPO | Admitting: Psychology

## 2020-01-27 ENCOUNTER — Other Ambulatory Visit: Payer: Self-pay

## 2020-01-27 ENCOUNTER — Ambulatory Visit
Admission: RE | Admit: 2020-01-27 | Discharge: 2020-01-27 | Disposition: A | Discharge status: Home / Self Care | Payer: Medicare PPO | Source: Ambulatory Visit | Attending: Family Medicine | Admitting: Family Medicine

## 2020-01-27 DIAGNOSIS — Z1231 Encounter for screening mammogram for malignant neoplasm of breast: Secondary | ICD-10-CM

## 2020-01-31 ENCOUNTER — Encounter: Payer: Self-pay | Admitting: Physical Therapy

## 2020-07-16 ENCOUNTER — Other Ambulatory Visit: Payer: Self-pay | Admitting: Family Medicine

## 2020-07-16 DIAGNOSIS — N63 Unspecified lump in unspecified breast: Secondary | ICD-10-CM

## 2020-08-29 ENCOUNTER — Other Ambulatory Visit: Payer: Medicare PPO

## 2020-10-03 ENCOUNTER — Ambulatory Visit
Admission: RE | Admit: 2020-10-03 | Discharge: 2020-10-03 | Disposition: A | Discharge status: Home / Self Care | Payer: Medicare PPO | Source: Ambulatory Visit | Attending: Family Medicine | Admitting: Family Medicine

## 2020-10-03 ENCOUNTER — Other Ambulatory Visit: Payer: Self-pay

## 2020-10-03 DIAGNOSIS — N63 Unspecified lump in unspecified breast: Secondary | ICD-10-CM

## 2020-11-10 ENCOUNTER — Emergency Department (HOSPITAL_BASED_OUTPATIENT_CLINIC_OR_DEPARTMENT_OTHER): Payer: Medicare PPO

## 2020-11-10 ENCOUNTER — Other Ambulatory Visit: Payer: Self-pay

## 2020-11-10 ENCOUNTER — Encounter (HOSPITAL_BASED_OUTPATIENT_CLINIC_OR_DEPARTMENT_OTHER): Payer: Self-pay

## 2020-11-10 ENCOUNTER — Emergency Department (HOSPITAL_BASED_OUTPATIENT_CLINIC_OR_DEPARTMENT_OTHER)
Admission: EM | Admit: 2020-11-10 | Discharge: 2020-11-10 | Disposition: A | Discharge status: Home / Self Care | Payer: Medicare PPO | Attending: Emergency Medicine | Admitting: Emergency Medicine

## 2020-11-10 DIAGNOSIS — W208XXA Other cause of strike by thrown, projected or falling object, initial encounter: Secondary | ICD-10-CM | POA: Diagnosis not present

## 2020-11-10 DIAGNOSIS — S9031XA Contusion of right foot, initial encounter: Secondary | ICD-10-CM | POA: Insufficient documentation

## 2020-11-10 DIAGNOSIS — S99921A Unspecified injury of right foot, initial encounter: Secondary | ICD-10-CM | POA: Diagnosis present

## 2020-11-10 DIAGNOSIS — E039 Hypothyroidism, unspecified: Secondary | ICD-10-CM | POA: Diagnosis not present

## 2020-11-10 DIAGNOSIS — Z79899 Other long term (current) drug therapy: Secondary | ICD-10-CM | POA: Insufficient documentation

## 2020-11-10 NOTE — ED Provider Notes (Signed)
[facility] EMERGENCY DEPT Provider Note   CSN: [id number] Arrival date & time: 11/10/20  1304     History Chief Complaint  Patient presents with   Foot Injury    Ashley Whitehead is a 75 y.o. female.  75 yo female with complaint of right foot pain after dropping a heavy mirror on her foot yesterday. Reports foot was discolored yesterday and painful to walk. Tried ice and IBU yesterday with some relief. No prior injuries or surgeries to the foot.       Past Medical History:  Diagnosis Date   Anxiety    Depression    GERD (gastroesophageal reflux disease)    Heart murmur    innocent   Hypercholesterolemia    Hypothyroidism    Osteoporosis    Renal infection    Wrist fracture     Patient Active Problem List   Diagnosis Date Noted   Colles' fracture of left radius, initial encounter for closed fracture 01/12/2018    Past Surgical History:  Procedure Laterality Date   BREAST SURGERY     lumpectomy   COLONOSCOPY     FINGER SURGERY     ORIF WRIST FRACTURE Left 01/12/2018   Procedure: OPEN REDUCTION INTERNAL FIXATION (ORIF) WRIST FRACTURE;  Surgeon: Dominica Severin, MD;  Location: [facility];  Service: Orthopedics;  Laterality: Left;  60 mins General with block   TONSILLECTOMY       OB History   No obstetric history on file.     Family History  Problem Relation Age of Onset   Cancer Mother     Social History   Tobacco Use   Smoking status: Never   Smokeless tobacco: Never  Vaping Use   Vaping Use: Never used  Substance Use Topics   Drug use: Never    Home Medications Prior to Admission medications   Medication Sig Start Date End Date Taking? Authorizing Provider  alendronate (FOSAMAX) 70 MG tablet Take 70 mg by mouth once a week. 12/22/17   [provider]  ALPRAZolam Prudy Feeler) 0.25 MG tablet Take 0.25 mg by mouth at bedtime as needed for anxiety. 09/09/17   [provider]  atorvastatin (LIPITOR) 20 MG tablet Take 20 mg by  mouth daily. 12/16/17   [provider]  cetirizine (ZYRTEC) 10 MG tablet Take 10 mg by mouth daily.    [provider]  Cholecalciferol (VITAMIN D) 50 MCG (2000 UT) tablet Take 2,000 Units by mouth daily.    [provider]  Cyanocobalamin (VITAMIN B-12 PO) Take 1 tablet by mouth daily.    [provider]  levothyroxine (SYNTHROID, LEVOTHROID) 100 MCG tablet Take 100 mcg by mouth daily before breakfast. 12/16/17   [provider]  meclizine (ANTIVERT) 25 MG tablet Take 25 mg by mouth 3 (three) times daily as needed for dizziness.    [provider]  PARoxetine (PAXIL) 40 MG tablet Take 40 mg by mouth daily. 10/20/17   [provider]    Allergies    Prednisone and Singulair [montelukast sodium]  Review of Systems   Review of Systems  Constitutional:  Negative for fever.  Musculoskeletal:  Positive for arthralgias and gait problem.  Skin:  Negative for wound.  Allergic/Immunologic: Negative for immunocompromised state.  Neurological:  Negative for weakness and numbness.  Hematological:  Does not bruise/bleed easily.   Physical Exam Updated Vital Signs BP 115/87 (BP Location: Left Arm)   Pulse 86   Temp 98.3 F (36.8 C) (Oral)  Resp 16   SpO2 95%   Physical Exam Vitals and nursing note reviewed.  Constitutional:      General: She is not in acute distress.    Appearance: She is well-developed. She is not diaphoretic.  HENT:     Head: Normocephalic and atraumatic.  Cardiovascular:     Pulses: Normal pulses.  Pulmonary:     Effort: Pulmonary effort is normal.  Musculoskeletal:        General: Tenderness present. No swelling or deformity.       Legs:     Comments: TTP medial and lateral right foot, DP pulse present, skin intact, sensation intact.   Skin:    General: Skin is warm and dry.     Findings: No erythema or rash.  Neurological:     Mental Status: She is alert and oriented to person, place, and time.      Sensory: No sensory deficit.     Motor: No weakness.  Psychiatric:        Behavior: Behavior normal.    ED Results / Procedures / Treatments   Labs (all labs ordered are listed, but only abnormal results are displayed) Labs Reviewed - No data to display  EKG None  Radiology DG Foot Complete Right  Result Date: 11/10/2020 CLINICAL DATA:  Right foot pain status post dropping object on foot. EXAM: RIGHT FOOT COMPLETE - 3+ VIEW COMPARISON:  None. FINDINGS: There is no evidence of fracture or dislocation. There is fusion between the third metatarsal and lateral cuneiform. Soft tissues are unremarkable. IMPRESSION: No acute fracture or dislocation. Electronically Signed   By: Acquanetta Belling M.D.   On: 11/10/2020 14:31    Procedures Procedures   Medications Ordered in ED Medications - No data to display  ED Course  I have reviewed the triage vital signs and the nursing notes.  Pertinent labs & imaging results that were available during my care of the patient were reviewed by me and considered in my medical decision making (see chart for details).  Clinical Course as of 11/10/20 1536  Sat Nov 10, 2020  1453 75 year old female with right foot injury as above.  Found to have tenderness to medial lateral right foot, DP pulses intact, sensation intact, skin intact.  Cap refill normal.  X-rays negative for fracture.  Patient will be placed in a postop shoe.  Weight-bear as tolerated follow-up with orthopedics if not improving. [LM]    Clinical Course User Index [LM] Alden Hipp   MDM Rules/Calculators/A&P                           Final Clinical Impression(s) / ED Diagnoses Final diagnoses:  Contusion of right foot, initial encounter    Rx / DC Orders ED Discharge Orders     None        Jeannie Fend, PA-C 11/10/20 1536    Tegeler, Canary Brim, MD 11/10/20 1537

## 2020-11-10 NOTE — ED Triage Notes (Signed)
Tells me she dropped a heavy object onto her right foot yesterday. She is c/o pain at proximal right foot.

## 2020-11-10 NOTE — Discharge Instructions (Signed)
Take Tylenol as needed as directed for pain.  You can apply ice for 20 minutes at a time over a thin cloth. Wear supportive shoe as needed.  Weight-bear as tolerated.  Follow-up with orthopedics if not improving.

## 2020-12-28 ENCOUNTER — Other Ambulatory Visit: Payer: Self-pay | Admitting: Family Medicine

## 2020-12-28 DIAGNOSIS — Z1231 Encounter for screening mammogram for malignant neoplasm of breast: Secondary | ICD-10-CM

## 2021-02-06 ENCOUNTER — Ambulatory Visit: Payer: Medicare PPO

## 2021-03-01 ENCOUNTER — Ambulatory Visit
Admission: RE | Admit: 2021-03-01 | Discharge: 2021-03-01 | Disposition: A | Discharge status: Home / Self Care | Payer: Medicare PPO | Source: Ambulatory Visit | Attending: Family Medicine | Admitting: Family Medicine

## 2021-03-01 DIAGNOSIS — Z1231 Encounter for screening mammogram for malignant neoplasm of breast: Secondary | ICD-10-CM

## 2021-08-08 ENCOUNTER — Other Ambulatory Visit: Payer: Self-pay | Admitting: Physician Assistant

## 2021-08-08 DIAGNOSIS — M8000XD Age-related osteoporosis with current pathological fracture, unspecified site, subsequent encounter for fracture with routine healing: Secondary | ICD-10-CM

## 2021-09-19 ENCOUNTER — Ambulatory Visit
Admission: RE | Admit: 2021-09-19 | Discharge: 2021-09-19 | Disposition: A | Discharge status: Home / Self Care | Payer: Medicare PPO | Source: Ambulatory Visit | Attending: Physician Assistant | Admitting: Physician Assistant

## 2021-09-19 DIAGNOSIS — M8000XD Age-related osteoporosis with current pathological fracture, unspecified site, subsequent encounter for fracture with routine healing: Secondary | ICD-10-CM

## 2021-10-15 IMAGING — MG MM DIGITAL DIAGNOSTIC UNILAT*L* W/ TOMO W/ CAD
6 series · 6 of 18 positions shown · non-contrast
Comparison: Previous exams.

CLINICAL DATA: 75-year-old female with a palpable area of concern
in the lower left breast.

EXAM:
DIGITAL DIAGNOSTIC UNILATERAL LEFT MAMMOGRAM WITH TOMOSYNTHESIS AND
CAD; ULTRASOUND LEFT BREAST LIMITED
TECHNIQUE: Left digital diagnostic mammography and breast tomosynthesis was
performed. The images were evaluated with computer-aided detection.;
Targeted ultrasound examination of the left breast was performed.

[L MLO synth-2D (1 of 2)]
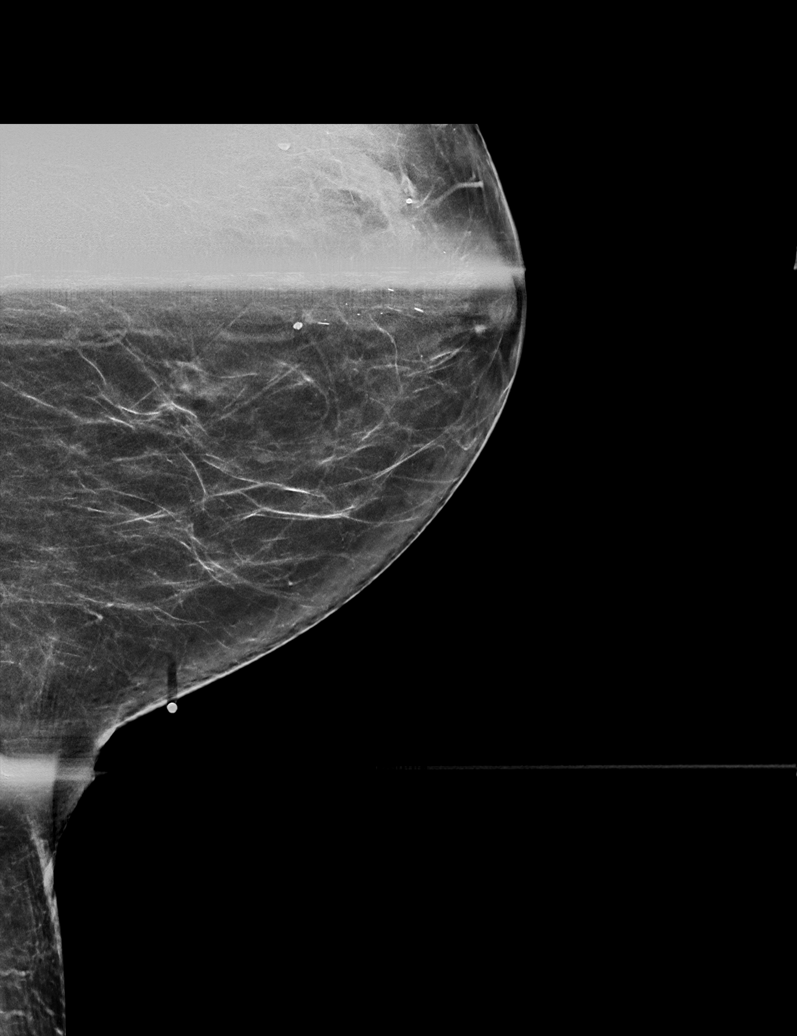

[L CC synth-2D]
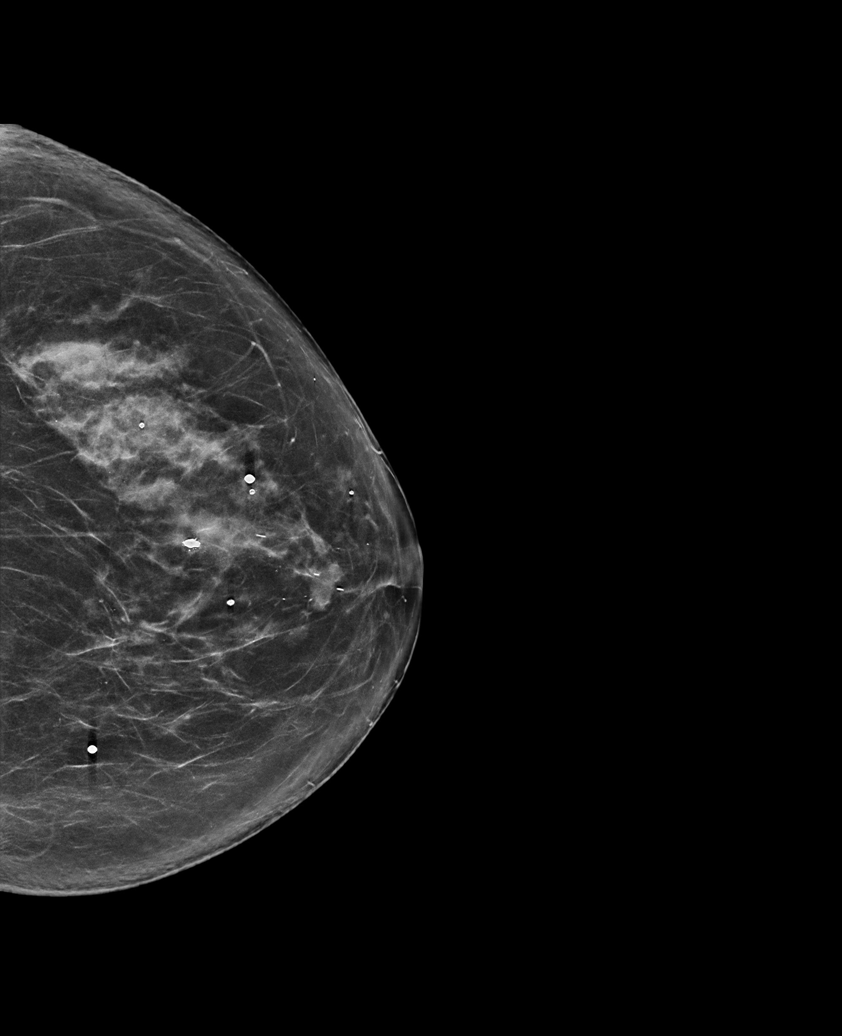

[L MLO synth-2D (2 of 2)]
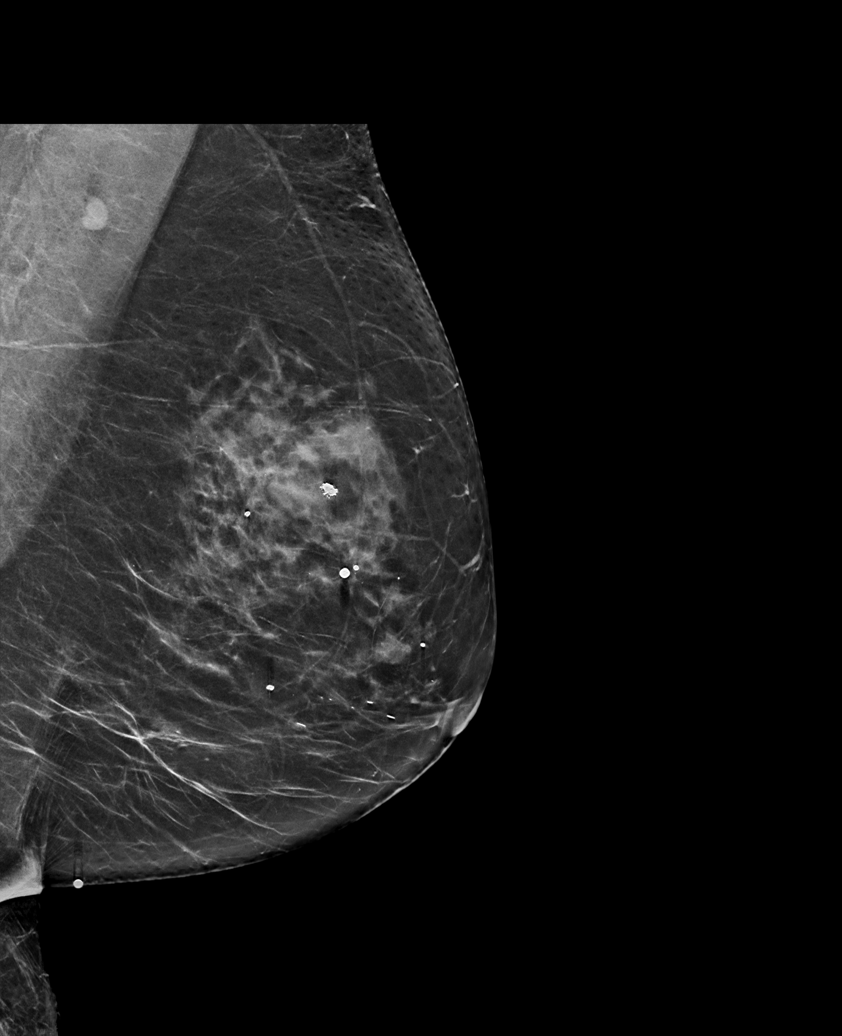

[L MLO tomo (1 of 2) · tomo slice 38/75.0]
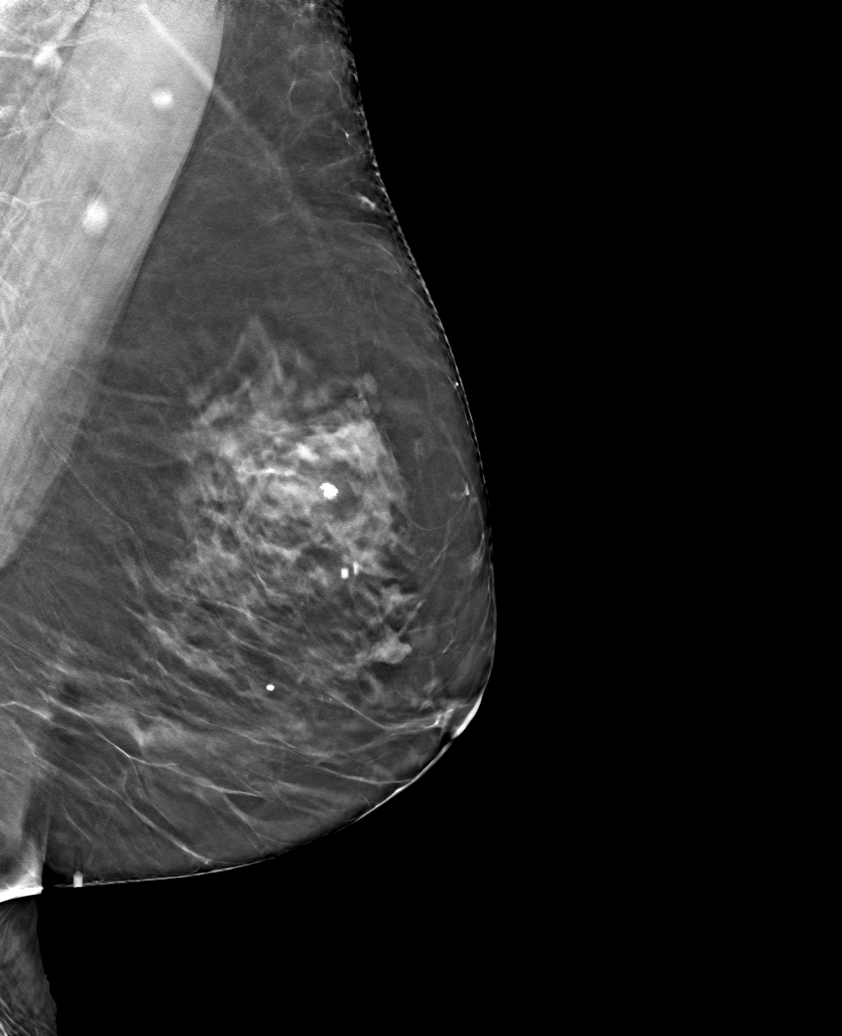

[L MLO tomo (2 of 2) · tomo slice 30/59.0]
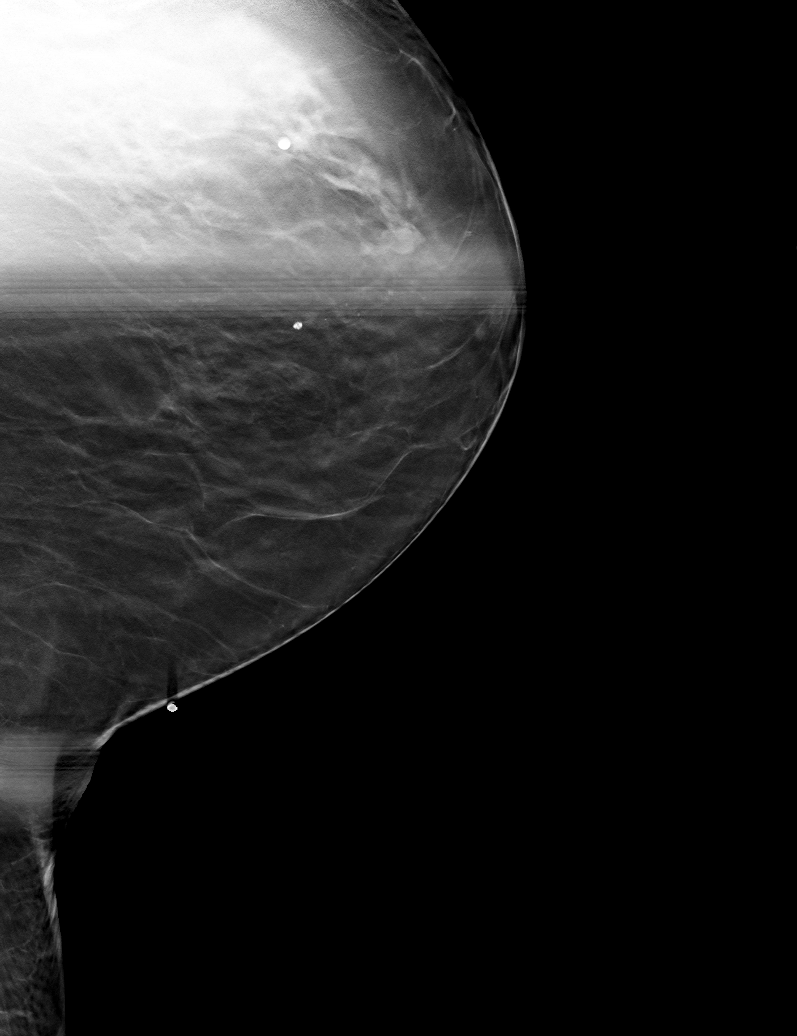

[L CC tomo · tomo slice 38/75.0]
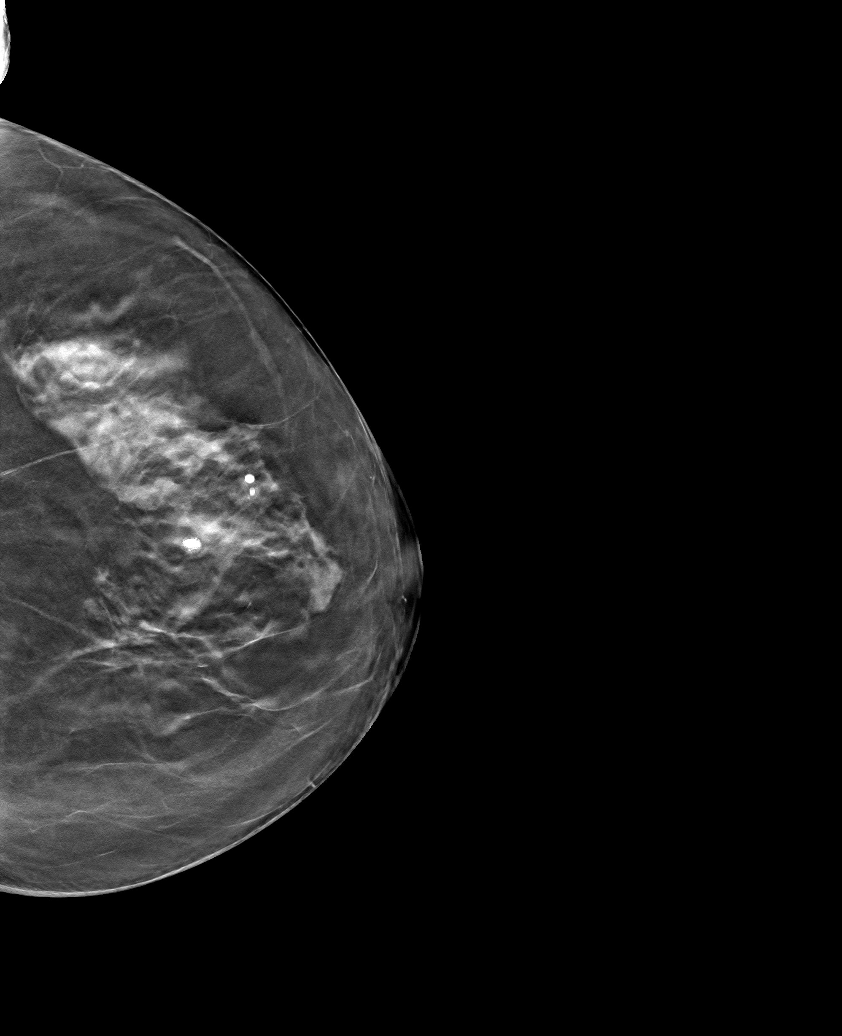

[6 of 18 positions shown; findings below may reference images not displayed]

ACR Breast Density Category c: The breast tissue is heterogeneously
dense, which may obscure small masses.
FINDINGS: No suspicious masses or calcifications are seen in the left breast.
Spot-compression MLO tomograms were performed over the palpable area
of concern in the inferior left breast with no abnormality seen.

Physical examination at site of concern in the lower left breast
does not reveal any definite palpable masses, only generalized soft
thickening.

Targeted ultrasound of the left breast was performed. No suspicious
masses or abnormality seen, only normal-appearing fibrofatty tissue
identified.
IMPRESSION: 1. No mammographic or sonographic abnormalities at site of palpable
concern in the lower left breast.

2.  No findings of malignancy in the left breast.

RECOMMENDATION:
Recommend annual routine screening mammography, due January 2021.

I have discussed the findings and recommendations with the patient.
If applicable, a reminder letter will be sent to the patient
regarding the next appointment.

BI-RADS CATEGORY  1: Negative.

## 2021-12-11 ENCOUNTER — Other Ambulatory Visit: Payer: Self-pay

## 2021-12-11 DIAGNOSIS — M810 Age-related osteoporosis without current pathological fracture: Secondary | ICD-10-CM

## 2021-12-17 ENCOUNTER — Telehealth: Payer: Self-pay | Admitting: Pharmacy Technician

## 2021-12-17 NOTE — Telephone Encounter (Signed)
Auth Submission: PENDING Payer: [employer] Medication & CPT/J Code(s) submitted: Evenity (Romosozumab) [id number] Route of submission (phone, fax, portal):  Phone # [phone] Fax # [phone] Auth type: Buy/Bill Units/visits requested: 210MG  Q4WKS Reference number:  Approval from:  to

## 2021-12-18 ENCOUNTER — Encounter: Payer: Self-pay | Admitting: Sports Medicine

## 2021-12-30 ENCOUNTER — Ambulatory Visit (INDEPENDENT_AMBULATORY_CARE_PROVIDER_SITE_OTHER): Payer: Medicare PPO

## 2021-12-30 VITALS — BP 115/74 | HR 88 | Temp 97.5°F | Resp 18 | Ht 64.5 in | Wt 149.8 lb

## 2021-12-30 DIAGNOSIS — M810 Age-related osteoporosis without current pathological fracture: Secondary | ICD-10-CM

## 2021-12-30 MED ORDER — ROMOSOZUMAB-AQQG 105 MG/1.17ML ~~LOC~~ SOSY
210.0000 mg | PREFILLED_SYRINGE | Freq: Once | SUBCUTANEOUS | Status: AC
  Administered 2021-12-30: 210 mg via SUBCUTANEOUS
  Filled 2021-12-30: qty 2.34

## 2021-12-30 NOTE — Progress Notes (Signed)
Diagnosis: Osteoporosis  Provider:  Chilton Greathouse MD  Procedure: Injection  Evenity (Romosozumab-aqqg), Dose: 210 mg, Site: subcutaneous, Number of injections: 2  Post Care: Observation period completed and Peripheral IV Discontinued  Discharge: Condition: Good, Destination: Home . AVS provided to patient.   Performed by:  Garnette Czech, RN

## 2022-01-27 ENCOUNTER — Ambulatory Visit (INDEPENDENT_AMBULATORY_CARE_PROVIDER_SITE_OTHER): Payer: Medicare PPO

## 2022-01-27 VITALS — BP 126/82 | HR 76 | Temp 98.2°F | Resp 16 | Ht 64.5 in | Wt 152.2 lb

## 2022-01-27 DIAGNOSIS — M810 Age-related osteoporosis without current pathological fracture: Secondary | ICD-10-CM

## 2022-01-27 MED ORDER — ROMOSOZUMAB-AQQG 105 MG/1.17ML ~~LOC~~ SOSY
210.0000 mg | PREFILLED_SYRINGE | Freq: Once | SUBCUTANEOUS | Status: AC
  Administered 2022-01-27: 210 mg via SUBCUTANEOUS

## 2022-01-27 NOTE — Progress Notes (Signed)
Diagnosis: Osteoporosis  Provider:  Praveen Mannam MD  Procedure: Injection  Evenity (Romosozumab-aqqg), Dose: 210 mg, Site: subcutaneous, Number of injections: 2  Post Care: Patient declined observation  Discharge: Condition: Good, Destination: Home . AVS provided to patient.   Performed by:  Breckyn Troyer E Selen Smucker, LPN       

## 2022-01-29 ENCOUNTER — Other Ambulatory Visit: Payer: Self-pay | Admitting: Family Medicine

## 2022-01-29 DIAGNOSIS — Z1231 Encounter for screening mammogram for malignant neoplasm of breast: Secondary | ICD-10-CM

## 2022-02-11 ENCOUNTER — Other Ambulatory Visit: Payer: Medicare PPO

## 2022-02-27 ENCOUNTER — Ambulatory Visit (INDEPENDENT_AMBULATORY_CARE_PROVIDER_SITE_OTHER): Payer: Medicare PPO | Admitting: *Deleted

## 2022-02-27 VITALS — BP 119/48 | HR 80 | Temp 98.0°F | Resp 16 | Ht 65.0 in | Wt 152.4 lb

## 2022-02-27 DIAGNOSIS — M810 Age-related osteoporosis without current pathological fracture: Secondary | ICD-10-CM | POA: Diagnosis not present

## 2022-02-27 MED ORDER — ROMOSOZUMAB-AQQG 105 MG/1.17ML ~~LOC~~ SOSY
210.0000 mg | PREFILLED_SYRINGE | Freq: Once | SUBCUTANEOUS | Status: AC
  Administered 2022-02-27: 210 mg via SUBCUTANEOUS
  Filled 2022-02-27: qty 2.34

## 2022-02-27 NOTE — Progress Notes (Addendum)
Diagnosis: Osteoporosis  Provider:  Praveen Mannam MD  Procedure: Injection  Evenity (Romosozumab-aqqg), Dose: 210 mg, Site: subcutaneous, Number of injections: 2  Post Care: Observation period completed  Discharge: Condition: Good, Destination: Home . AVS provided to patient.   Performed by:  Alianna Wurster A, RN       

## 2022-03-27 ENCOUNTER — Ambulatory Visit (INDEPENDENT_AMBULATORY_CARE_PROVIDER_SITE_OTHER): Payer: Medicare PPO

## 2022-03-27 VITALS — BP 112/72 | HR 74 | Temp 98.6°F | Resp 18 | Ht 65.0 in | Wt 150.2 lb

## 2022-03-27 DIAGNOSIS — M810 Age-related osteoporosis without current pathological fracture: Secondary | ICD-10-CM

## 2022-03-27 MED ORDER — ROMOSOZUMAB-AQQG 105 MG/1.17ML ~~LOC~~ SOSY
210.0000 mg | PREFILLED_SYRINGE | Freq: Once | SUBCUTANEOUS | Status: AC
  Administered 2022-03-27: 210 mg via SUBCUTANEOUS

## 2022-03-27 NOTE — Progress Notes (Signed)
Diagnosis: Osteoporosis  Provider:  Marshell Garfinkel MD  Procedure: Injection  Evenity, Dose: 210 mg, Site: subcutaneous, Number of injections: 2  Post Care:  n/a  Discharge: Condition: Good, Destination: Home . AVS Provided and AVS Declined  Performed by:  Cleophus Molt, RN

## 2022-03-28 ENCOUNTER — Ambulatory Visit: Payer: Medicare PPO

## 2022-04-18 ENCOUNTER — Ambulatory Visit
Admission: RE | Admit: 2022-04-18 | Discharge: 2022-04-18 | Disposition: A | Discharge status: Home / Self Care | Payer: Medicare PPO | Source: Ambulatory Visit | Attending: Family Medicine | Admitting: Family Medicine

## 2022-04-18 DIAGNOSIS — Z1231 Encounter for screening mammogram for malignant neoplasm of breast: Secondary | ICD-10-CM

## 2022-04-24 ENCOUNTER — Ambulatory Visit (INDEPENDENT_AMBULATORY_CARE_PROVIDER_SITE_OTHER): Payer: Medicare PPO

## 2022-04-24 VITALS — BP 145/78 | HR 82 | Temp 97.5°F | Resp 20 | Ht 65.0 in | Wt 149.6 lb

## 2022-04-24 DIAGNOSIS — M810 Age-related osteoporosis without current pathological fracture: Secondary | ICD-10-CM

## 2022-04-24 MED ORDER — ROMOSOZUMAB-AQQG 105 MG/1.17ML ~~LOC~~ SOSY
210.0000 mg | PREFILLED_SYRINGE | Freq: Once | SUBCUTANEOUS | Status: AC
  Administered 2022-04-24: 210 mg via SUBCUTANEOUS
  Filled 2022-04-24: qty 2.34

## 2022-04-24 NOTE — Progress Notes (Signed)
Diagnosis: Osteoporosis  Provider:  Marshell Garfinkel MD  Procedure: Injection  Evenity (Romosozumab-aqqg), Dose: 210 mg, Site: subcutaneous, Number of injections: 2  Post Care: Patient declined observation  Discharge: Condition: Good, Destination: Home . AVS Provided  Performed by:  Fraser Din [place]-Burchett, RN

## 2022-05-22 ENCOUNTER — Ambulatory Visit (INDEPENDENT_AMBULATORY_CARE_PROVIDER_SITE_OTHER): Payer: Medicare PPO

## 2022-05-22 VITALS — BP 101/69 | HR 82 | Temp 98.0°F | Resp 16 | Ht 65.0 in | Wt 151.0 lb

## 2022-05-22 DIAGNOSIS — M810 Age-related osteoporosis without current pathological fracture: Secondary | ICD-10-CM | POA: Diagnosis not present

## 2022-05-22 MED ORDER — ROMOSOZUMAB-AQQG 105 MG/1.17ML ~~LOC~~ SOSY
210.0000 mg | PREFILLED_SYRINGE | Freq: Once | SUBCUTANEOUS | Status: AC
  Administered 2022-05-22: 210 mg via SUBCUTANEOUS
  Filled 2022-05-22: qty 2.34

## 2022-05-22 NOTE — Progress Notes (Signed)
Diagnosis: Osteoporosis  Provider:  Chilton Greathouse MD  Procedure: Injection  Evenity (Romosozumab-aqqg), Dose: 210 mg, Site: subcutaneous, Number of injections: 2  Post Care:  n/a  Discharge: Condition: Good, Destination: Home . AVS Provided  Performed by:  Loney Hering, LPN

## 2022-06-19 ENCOUNTER — Ambulatory Visit (INDEPENDENT_AMBULATORY_CARE_PROVIDER_SITE_OTHER): Payer: Medicare PPO | Admitting: *Deleted

## 2022-06-19 VITALS — BP 125/81 | HR 69 | Temp 98.0°F | Resp 16 | Ht 65.0 in | Wt 151.4 lb

## 2022-06-19 DIAGNOSIS — M810 Age-related osteoporosis without current pathological fracture: Secondary | ICD-10-CM | POA: Diagnosis not present

## 2022-06-19 MED ORDER — ROMOSOZUMAB-AQQG 105 MG/1.17ML ~~LOC~~ SOSY
210.0000 mg | PREFILLED_SYRINGE | Freq: Once | SUBCUTANEOUS | Status: AC
  Administered 2022-06-19: 210 mg via SUBCUTANEOUS
  Filled 2022-06-19: qty 2.34

## 2022-06-19 NOTE — Progress Notes (Signed)
Diagnosis: Osteoporosis  Provider:  Chilton Greathouse MD  Procedure: Injection  Evenity (Romosozumab-aqqg), Dose: 210 mg, Site: subcutaneous, Number of injections: 2  Post Care: Observation period completed  Discharge: Condition: Good, Destination: Home . AVS Declined  Performed by:  Forrest Moron, RN

## 2022-07-17 ENCOUNTER — Ambulatory Visit: Payer: Medicare PPO

## 2022-07-17 ENCOUNTER — Ambulatory Visit (INDEPENDENT_AMBULATORY_CARE_PROVIDER_SITE_OTHER): Payer: Medicare PPO

## 2022-07-17 VITALS — BP 118/74 | HR 76 | Temp 98.3°F | Resp 18 | Ht 70.0 in | Wt 138.1 lb

## 2022-07-17 DIAGNOSIS — M810 Age-related osteoporosis without current pathological fracture: Secondary | ICD-10-CM | POA: Diagnosis not present

## 2022-07-17 MED ORDER — ROMOSOZUMAB-AQQG 105 MG/1.17ML ~~LOC~~ SOSY
210.0000 mg | PREFILLED_SYRINGE | Freq: Once | SUBCUTANEOUS | Status: AC
  Administered 2022-07-17: 210 mg via SUBCUTANEOUS
  Filled 2022-07-17: qty 2.34

## 2022-07-17 NOTE — Progress Notes (Signed)
Diagnosis: Osteoporosis  Provider:  Chilton Greathouse MD  Procedure: Injection  Evenity (Romosozumab-aqqg), Dose: 210 mg, Site: subcutaneous, Number of injections: 2  Right arm  105 mg Left arm    105 mg  Post Care: Patient declined observation  Discharge: Condition: Good, Destination: Home . AVS Provided  Performed by:  Marlow Baars [place]-Burchett, RN

## 2022-08-18 ENCOUNTER — Ambulatory Visit: Payer: Medicare PPO

## 2022-08-20 ENCOUNTER — Ambulatory Visit: Payer: Medicare PPO

## 2022-08-20 VITALS — BP 132/68 | HR 77 | Temp 97.8°F | Resp 12 | Ht 64.0 in | Wt 149.6 lb

## 2022-08-20 DIAGNOSIS — M810 Age-related osteoporosis without current pathological fracture: Secondary | ICD-10-CM

## 2022-08-20 MED ORDER — ROMOSOZUMAB-AQQG 105 MG/1.17ML ~~LOC~~ SOSY
210.0000 mg | PREFILLED_SYRINGE | Freq: Once | SUBCUTANEOUS | Status: AC
  Administered 2022-08-20: 210 mg via SUBCUTANEOUS

## 2022-08-20 NOTE — Progress Notes (Signed)
Diagnosis: Osteoporosis  Provider:  Chilton Greathouse MD  Procedure: Injection  Evenity (Romosozumab-aqqg), Dose: 210 mg, Site: subcutaneous, Number of injections: 2  Injections given one in each arm.  Post Care: Patient declined observation  Discharge: Condition: Stable, Destination: Home . AVS Declined  Performed by:  Wyvonne Lenz, RN

## 2022-09-18 ENCOUNTER — Ambulatory Visit (INDEPENDENT_AMBULATORY_CARE_PROVIDER_SITE_OTHER): Payer: Medicare PPO

## 2022-09-18 VITALS — BP 125/75 | HR 77 | Temp 97.9°F | Resp 16 | Ht 65.0 in | Wt 150.8 lb

## 2022-09-18 DIAGNOSIS — M810 Age-related osteoporosis without current pathological fracture: Secondary | ICD-10-CM | POA: Diagnosis not present

## 2022-09-18 MED ORDER — ROMOSOZUMAB-AQQG 105 MG/1.17ML ~~LOC~~ SOSY
210.0000 mg | PREFILLED_SYRINGE | Freq: Once | SUBCUTANEOUS | Status: AC
  Administered 2022-09-18: 210 mg via SUBCUTANEOUS

## 2022-09-18 NOTE — Progress Notes (Signed)
Diagnosis: Osteoporosis  Provider:  Chilton Greathouse MD  Procedure: Injection  Evenity (Romosozumab-aqqg), Dose: 210 mg, Site: subcutaneous, Number of injections: 2  Post Care: Observation period completed  Discharge: Condition: Stable, Destination: Home . AVS Provided  Performed by:  Fritzi Mandes, RN

## 2022-10-16 ENCOUNTER — Ambulatory Visit (INDEPENDENT_AMBULATORY_CARE_PROVIDER_SITE_OTHER): Payer: Medicare PPO

## 2022-10-16 VITALS — BP 118/80 | HR 85 | Temp 98.1°F | Resp 18 | Ht 65.0 in | Wt 150.6 lb

## 2022-10-16 DIAGNOSIS — M810 Age-related osteoporosis without current pathological fracture: Secondary | ICD-10-CM

## 2022-10-16 MED ORDER — ROMOSOZUMAB-AQQG 105 MG/1.17ML ~~LOC~~ SOSY
210.0000 mg | PREFILLED_SYRINGE | Freq: Once | SUBCUTANEOUS | Status: AC
  Administered 2022-10-16: 210 mg via SUBCUTANEOUS
  Filled 2022-10-16: qty 2.34

## 2022-10-16 NOTE — Progress Notes (Signed)
Diagnosis: Osteoporosis  Provider:  Chilton Greathouse MD  Procedure: Injection  Evenity (Romosozumab-aqqg), Dose: 210 mg, Site: subcutaneous, Number of injections: 2  Post Care: Patient declined observation. Left and right arm injections  Discharge: Condition: Good, Destination: Home . AVS Declined  Performed by:  Rico Ala, LPN

## 2022-10-20 ENCOUNTER — Ambulatory Visit: Payer: Medicare PPO

## 2022-11-14 ENCOUNTER — Ambulatory Visit: Payer: Medicare PPO

## 2022-11-20 ENCOUNTER — Ambulatory Visit: Payer: Medicare PPO

## 2022-11-27 ENCOUNTER — Other Ambulatory Visit: Payer: Self-pay

## 2022-11-28 ENCOUNTER — Telehealth: Payer: Self-pay

## 2022-11-28 NOTE — Telephone Encounter (Signed)
Auth Submission: APPROVED Site of care: Site of care: [facility] Payer: [employer] medicare Medication & CPT/J Code(s) submitted: Prolia (Denosumab) [id number] Route of submission (phone, fax, portal): portal Phone # Fax # Auth type: Buy/Bill PB Units/visits requested: 60mg  x 2 doses Reference number: [id number] Approval from: 11/27/2022 to 02/10/2024

## 2022-12-09 ENCOUNTER — Ambulatory Visit (INDEPENDENT_AMBULATORY_CARE_PROVIDER_SITE_OTHER): Payer: Medicare PPO

## 2022-12-09 VITALS — BP 137/82 | HR 77 | Temp 97.9°F | Resp 18 | Ht 65.0 in | Wt 150.2 lb

## 2022-12-09 DIAGNOSIS — M810 Age-related osteoporosis without current pathological fracture: Secondary | ICD-10-CM

## 2022-12-09 MED ORDER — DENOSUMAB 60 MG/ML ~~LOC~~ SOSY
60.0000 mg | PREFILLED_SYRINGE | Freq: Once | SUBCUTANEOUS | Status: AC
  Administered 2022-12-09: 60 mg via SUBCUTANEOUS

## 2022-12-09 NOTE — Progress Notes (Signed)
Diagnosis: Osteoporosis  Provider:  Chilton Greathouse MD  Procedure: Injection  Prolia (Denosumab), Dose: 60 mg, Site: subcutaneous, Number of injections: 1  Post Care: Observation period completed  Discharge: Condition: Good, Destination: Home . AVS Declined  Performed by:  Marlow Baars [place]-Burchett, RN

## 2022-12-15 ENCOUNTER — Telehealth: Payer: Self-pay | Admitting: Pharmacy Technician

## 2022-12-15 NOTE — Telephone Encounter (Signed)
Auth Submission: APPROVED PA RENEWAL Site of care: Site of care: [facility] Payer: [employer] Medication & CPT/J Code(s) submitted: Evenity (Romosozumab) [id number] Route of submission (phone, fax, portal):  Phone # Fax # Auth type: Buy/Bill PB Units/visits requested: 210MG  Q4WKS Reference number: [id number] Approval from: 02/10/22 to 02/02/24

## 2023-03-17 ENCOUNTER — Other Ambulatory Visit: Payer: Self-pay | Admitting: Family Medicine

## 2023-03-17 DIAGNOSIS — Z1231 Encounter for screening mammogram for malignant neoplasm of breast: Secondary | ICD-10-CM

## 2023-04-20 ENCOUNTER — Ambulatory Visit
Admission: RE | Admit: 2023-04-20 | Discharge: 2023-04-20 | Disposition: A | Discharge status: Home / Self Care | Payer: Medicare PPO | Source: Ambulatory Visit | Attending: Family Medicine | Admitting: Family Medicine

## 2023-04-20 DIAGNOSIS — Z1231 Encounter for screening mammogram for malignant neoplasm of breast: Secondary | ICD-10-CM

## 2023-06-11 ENCOUNTER — Ambulatory Visit: Payer: Medicare PPO

## 2023-06-11 VITALS — BP 121/77 | HR 88 | Temp 98.9°F | Resp 16 | Ht 65.0 in | Wt 153.6 lb

## 2023-06-11 DIAGNOSIS — M810 Age-related osteoporosis without current pathological fracture: Secondary | ICD-10-CM

## 2023-06-11 MED ORDER — DENOSUMAB 60 MG/ML ~~LOC~~ SOSY
60.0000 mg | PREFILLED_SYRINGE | Freq: Once | SUBCUTANEOUS | Status: AC
Start: 2023-06-11 — End: 2023-06-11
  Administered 2023-06-11: 60 mg via SUBCUTANEOUS
  Filled 2023-06-11: qty 1

## 2023-06-11 NOTE — Progress Notes (Signed)
 Diagnosis: Osteoporosis  Provider:  Chilton Greathouse MD  Procedure: Injection  Prolia (Denosumab), Dose: 60 mg, Site: subcutaneous, Number of injections: 1  Injection Site(s): Right arm  Post Care: right arm injection  Discharge: Condition: Good, Destination: Home . AVS Declined  Performed by:  Rico Ala, LPN

## 2023-11-17 ENCOUNTER — Encounter: Payer: Self-pay | Admitting: Psychology

## 2023-11-26 ENCOUNTER — Other Ambulatory Visit: Payer: Self-pay | Admitting: Sports Medicine

## 2023-12-01 ENCOUNTER — Telehealth: Payer: Self-pay

## 2023-12-01 NOTE — Telephone Encounter (Signed)
 Auth Submission: APPROVED Site of care: Site of care: [facility] Payer: [employer] medicare Medication & CPT/J Code(s) submitted: Prolia  (Denosumab ) [id number] Diagnosis Code:  Route of submission (phone, fax, portal): portal Phone # Fax # Auth type: Buy/Bill PB Units/visits requested: 60mg  x 2 doses Reference number: [id number] Approval from: 02/11/24 to 02/09/25   There is a current auth on file until 02/10/24 as well.

## 2023-12-14 ENCOUNTER — Ambulatory Visit

## 2023-12-14 MED ORDER — DENOSUMAB 60 MG/ML ~~LOC~~ SOSY: 60.0000 mg | PREFILLED_SYRINGE | Freq: Once | SUBCUTANEOUS | Status: DC

## 2023-12-15 ENCOUNTER — Encounter: Payer: Self-pay | Admitting: Sports Medicine

## 2023-12-23 ENCOUNTER — Ambulatory Visit (INDEPENDENT_AMBULATORY_CARE_PROVIDER_SITE_OTHER)

## 2023-12-23 VITALS — BP 111/75 | HR 71 | Temp 98.5°F | Resp 16 | Ht 65.0 in | Wt 153.2 lb

## 2023-12-23 DIAGNOSIS — M810 Age-related osteoporosis without current pathological fracture: Secondary | ICD-10-CM

## 2023-12-23 MED ORDER — DENOSUMAB 60 MG/ML ~~LOC~~ SOSY
60.0000 mg | PREFILLED_SYRINGE | Freq: Once | SUBCUTANEOUS | Status: AC
  Administered 2023-12-23: 60 mg via SUBCUTANEOUS
  Filled 2023-12-23: qty 1

## 2023-12-23 NOTE — Progress Notes (Signed)
 Diagnosis: Osteoporosis  Provider:  Mannam, Praveen MD  Procedure: Injection  Prolia  (Denosumab ), Dose: 60 mg, Site: subcutaneous, Number of injections: 1  Injection Site(s): Right arm  Post Care: Patient declined observation  Discharge: Condition: Good, Destination: Home . AVS Declined  Performed by:  Lendel Quant, RN

## 2024-01-14 ENCOUNTER — Ambulatory Visit: Admitting: Psychology

## 2024-06-22 ENCOUNTER — Ambulatory Visit
